# Patient Record
Sex: Male | Born: 1980 | Hispanic: No | Marital: Married | Smoking: Never smoker
Health system: Southern US, Community
[De-identification: ages and names within clinical notes are randomized; demographics above are authoritative.]

---
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
## 2017-10-23 ENCOUNTER — Ambulatory Visit (INDEPENDENT_AMBULATORY_CARE_PROVIDER_SITE_OTHER): Payer: Self-pay | Admitting: Physician Assistant

## 2017-10-23 ENCOUNTER — Other Ambulatory Visit: Payer: Self-pay

## 2017-10-23 ENCOUNTER — Encounter (INDEPENDENT_AMBULATORY_CARE_PROVIDER_SITE_OTHER): Payer: Self-pay | Admitting: Physician Assistant

## 2017-10-23 VITALS — BP 126/72 | HR 69 | Temp 98.6°F | Ht 67.5 in | Wt 203.0 lb

## 2017-10-23 DIAGNOSIS — R1013 Epigastric pain: Secondary | ICD-10-CM

## 2017-10-23 DIAGNOSIS — R229 Localized swelling, mass and lump, unspecified: Secondary | ICD-10-CM

## 2017-10-23 DIAGNOSIS — K219 Gastro-esophageal reflux disease without esophagitis: Secondary | ICD-10-CM

## 2017-10-23 MED ORDER — OMEPRAZOLE 40 MG PO CPDR: 40.0000 mg | DELAYED_RELEASE_CAPSULE | Freq: Every day | ORAL | 3 refills | Status: DC

## 2017-10-23 NOTE — Progress Notes (Signed)
   Subjective:  Patient ID: Tommy Steele, male    DOB: 1980-11-13  Age: 37 y.o. MRN: [id number]  CC: masses on arms, legs  HPI Tommy Steele is a 37 y.o. male with no significant medical history presents with masses on arms and legs. Onset since he was a child      Also complains of acid reflux to the throat with subsequent sleep disturbance. Occurs daily with all foods and associated with epigastric pain that radiates to the left lower pectoralis. Some bloating and flatulence. Has taken OTC anti-acids with some relief. Noted one episode of BRBPR that self resolved over two days. Does not endorse melena, nausea, vomiting, fever, chills, diarrhea, constipation, rash, cafe au-lait spots, swelling, SOB, or GU sxs.      ROS Review of Systems  Constitutional: Negative for chills, fever and malaise/fatigue.  Eyes: Negative for blurred vision.  Respiratory: Negative for shortness of breath.   Cardiovascular: Negative for chest pain and palpitations.  Gastrointestinal: Positive for abdominal pain and heartburn. Negative for nausea.  Genitourinary: Negative for dysuria and hematuria.  Musculoskeletal: Negative for joint pain and myalgias.  Skin: Negative for rash.       nodules  Neurological: Negative for tingling and headaches.  Psychiatric/Behavioral: Negative for depression. The patient is not nervous/anxious.     Objective:  BP 126/72 (BP Location: Left Arm, Patient Position: Sitting, Cuff Size: Normal)   Pulse 69   Temp 98.6 F (37 C) (Oral)   Ht 5' 7.5" (1.715 m)   Wt 203 lb (92.1 kg)   SpO2 98%   BMI 31.33 kg/m   BP/Weight 08/25/8248  Systolic BP 037  Diastolic BP 72  Wt. (Lbs) 203  BMI 31.33      Physical Exam  Constitutional: He is oriented to person, place, and time.  Well developed, well nourished, NAD, polite  HENT:  Head: Normocephalic and atraumatic.  Eyes: No scleral icterus.  Neck: Normal range of motion. Neck supple. No thyromegaly present.   Cardiovascular: Normal rate, regular rhythm and normal heart sounds.  Pulmonary/Chest: Effort normal and breath sounds normal.  Abdominal: Soft. Bowel sounds are normal. There is no tenderness.  Musculoskeletal: He exhibits no edema.  Neurological: He is alert and oriented to person, place, and time.  Skin: Skin is warm and dry. No rash noted. No erythema. No pallor.  Few, small, freely movable nodules in the forearm bilaterally and one in the right thigh.  Psychiatric: He has a normal mood and affect. His behavior is normal. Thought content normal.  Vitals reviewed.    Assessment & Plan:   1. Multiple skin nodules` - CBC with Differential - Comprehensive metabolic panel - ANA w/Reflex - Rheumatoid factor  2. Gastroesophageal reflux disease, esophagitis presence not specified - omeprazole (PRILOSEC) 40 MG capsule; Take 1 capsule (40 mg total) by mouth daily.  Dispense: 30 capsule; Refill: 3  3. Abdominal pain, epigastric - H. pylori antibody, IgG   Meds ordered this encounter  Medications  . omeprazole (PRILOSEC) 40 MG capsule    Sig: Take 1 capsule (40 mg total) by mouth daily.    Dispense:  30 capsule    Refill:  3    Order Specific Question:   Supervising Provider    Answer:   Tresa Garter [[id number]]    Follow-up: Return in about 1 month (around 11/20/2017).   Clent Demark PA

## 2017-10-23 NOTE — Patient Instructions (Signed)
Opciones de alimentos para pacientes con reflujo gastroesofágico - Adultos  (Food Choices for Gastroesophageal Reflux Disease, Adult)  Cuando se tiene reflujo gastroesofágico (ERGE), los alimentos que se ingieren y los hábitos de alimentación son muy importantes. Elegir los alimentos adecuados puede ayudar a aliviar las molestias.  ¿QUÉ PAUTAS DEBO SEGUIR?  · Elija las frutas, los vegetales, los cereales integrales y los productos lácteos con bajo contenido de grasa.  · Elija las carnes de vaca, de pescado y de ave con bajo contenido de grasas.  · Limite las grasas, como los aceites, los aderezos para ensalada, la manteca, los frutos secos y el aguacate.  · Lleve un registro de alimentos. Esto ayuda a identificar los alimentos que ocasionan síntomas.  · Evite los alimentos que le ocasionen síntomas. Pueden ser distintos para cada persona.  · Haga comidas pequeñas durante el día en lugar de 3 comidas abundantes.  · Coma lentamente, en un lugar donde esté distendido.  · Limite el consumo de alimentos fritos.  · Cocine los alimentos utilizando métodos que no sean la fritura.  · Evite el consumo alcohol.  · Evite beber grandes cantidades de líquidos con las comidas.  · Evite agacharse o recostarse hasta después de 2 o 3 horas de haber comido.    ¿QUÉ ALIMENTOS NO SE RECOMIENDAN?  Estos son algunos alimentos y bebidas que pueden empeorar los síntomas:  Vegetales  Tomates. Jugo de tomate. Salsa de tomate y espagueti. Ajíes. Cebolla y ajo. Rábano picante.  Frutas  Naranjas, pomelos y limón (fruta y jugo).  Carnes  Carnes de vaca, de pescado y de ave con gran contenido de grasas. Esto incluye los perros calientes, las costillas, el jamón, la salchicha, el salame y el tocino.  Lácteos  Leche entera y leche chocolatada. Crema ácida. Crema. Mantequilla. Helados. Queso crema.  Bebidas  Té o café. Bebidas gaseosas o bebidas energizantes.  Condimentos  Salsa picante. Salsa barbacoa.  Dulces/postres   Chocolate y cacao. Rosquillas. Menta y mentol.  Grasas y aceites  Alimentos muy grasos. Esto incluye las papas fritas.  Otros  Vinagre. Especias picantes. Esto incluye la pimienta negra, la pimienta blanca, la pimienta roja, la pimienta de cayena, el curry en polvo, los clavos de olor, el jengibre y el chile en polvo.  Esta no es una lista completa de los alimentos y las bebidas que se deben evitar. Comuníquese con el nutricionista para recibir más información.  Esta información no tiene como fin reemplazar el consejo del médico. Asegúrese de hacerle al médico cualquier pregunta que tenga.  Document Released: 12/18/2011 Document Revised: 07/09/2014 Document Reviewed: 04/22/2013  Elsevier Interactive Patient Education © 2017 Elsevier Inc.

## 2017-10-24 ENCOUNTER — Telehealth (INDEPENDENT_AMBULATORY_CARE_PROVIDER_SITE_OTHER): Payer: Self-pay

## 2017-10-24 LAB — COMPREHENSIVE METABOLIC PANEL
ALK PHOS: 104 IU/L (ref 39–117)
ALT: 37 IU/L (ref 0–44)
AST: 23 IU/L (ref 0–40)
Albumin/Globulin Ratio: 1.8 (ref 1.2–2.2)
Albumin: 4.7 g/dL (ref 3.5–5.5)
BUN/Creatinine Ratio: 18 (ref 9–20)
BUN: 13 mg/dL (ref 6–20)
Bilirubin Total: 0.6 mg/dL (ref 0.0–1.2)
CALCIUM: 10 mg/dL (ref 8.7–10.2)
CO2: 22 mmol/L (ref 20–29)
CREATININE: 0.74 mg/dL — AB (ref 0.76–1.27)
Chloride: 106 mmol/L (ref 96–106)
GFR calc Af Amer: 137 mL/min/{1.73_m2} (ref >59)
GFR, EST NON AFRICAN AMERICAN: 119 mL/min/{1.73_m2} (ref >59)
GLOBULIN, TOTAL: 2.6 g/dL (ref 1.5–4.5)
GLUCOSE: 94 mg/dL (ref 65–99)
Potassium: 4 mmol/L (ref 3.5–5.2)
SODIUM: 140 mmol/L (ref 134–144)
Total Protein: 7.3 g/dL (ref 6.0–8.5)

## 2017-10-24 LAB — CBC WITH DIFFERENTIAL/PLATELET
BASOS ABS: 0 10*3/uL (ref 0.0–0.2)
Basos: 0 %
EOS (ABSOLUTE): 0.2 10*3/uL (ref 0.0–0.4)
EOS: 2 %
HEMATOCRIT: 43.3 % (ref 37.5–51.0)
HEMOGLOBIN: 14.5 g/dL (ref 13.0–17.7)
IMMATURE GRANULOCYTES: 0 %
Immature Grans (Abs): 0 10*3/uL (ref 0.0–0.1)
Lymphocytes Absolute: 2.4 10*3/uL (ref 0.7–3.1)
Lymphs: 36 %
MCH: 29.6 pg (ref 26.6–33.0)
MCHC: 33.5 g/dL (ref 31.5–35.7)
MCV: 88 fL (ref 79–97)
MONOCYTES: 5 %
Monocytes Absolute: 0.3 10*3/uL (ref 0.1–0.9)
NEUTROS PCT: 57 %
Neutrophils Absolute: 3.8 10*3/uL (ref 1.4–7.0)
Platelets: 225 10*3/uL (ref 150–379)
RBC: 4.9 x10E6/uL (ref 4.14–5.80)
RDW: 14 % (ref 12.3–15.4)
WBC: 6.8 10*3/uL (ref 3.4–10.8)

## 2017-10-24 LAB — ANA W/REFLEX: Anti Nuclear Antibody(ANA): NEGATIVE

## 2017-10-24 LAB — H. PYLORI ANTIBODY, IGG

## 2017-10-24 LAB — RHEUMATOID FACTOR

## 2017-10-24 NOTE — Telephone Encounter (Signed)
-----   Message from Clent Demark, PA-C sent at 10/24/2017  1:49 PM EDT ----- All labs are normal.

## 2017-10-24 NOTE — Telephone Encounter (Signed)
Patient is aware of normal labs. Tommy Steele, CMA

## 2017-12-04 ENCOUNTER — Ambulatory Visit (INDEPENDENT_AMBULATORY_CARE_PROVIDER_SITE_OTHER): Payer: Self-pay | Admitting: Physician Assistant

## 2017-12-04 ENCOUNTER — Other Ambulatory Visit: Payer: Self-pay

## 2017-12-04 ENCOUNTER — Encounter (INDEPENDENT_AMBULATORY_CARE_PROVIDER_SITE_OTHER): Payer: Self-pay | Admitting: Physician Assistant

## 2017-12-04 VITALS — BP 100/55 | HR 67 | Temp 98.2°F | Ht 67.5 in | Wt 201.6 lb

## 2017-12-04 DIAGNOSIS — R229 Localized swelling, mass and lump, unspecified: Secondary | ICD-10-CM

## 2017-12-04 DIAGNOSIS — L255 Unspecified contact dermatitis due to plants, except food: Secondary | ICD-10-CM

## 2017-12-04 DIAGNOSIS — K219 Gastro-esophageal reflux disease without esophagitis: Secondary | ICD-10-CM

## 2017-12-04 MED ORDER — PREDNISONE 10 MG PO TABS: ORAL_TABLET | ORAL | 0 refills | Status: DC

## 2017-12-04 MED ORDER — OMEPRAZOLE 40 MG PO CPDR: 40.0000 mg | DELAYED_RELEASE_CAPSULE | Freq: Every day | ORAL | 11 refills | Status: DC

## 2017-12-04 MED ORDER — PREDNISONE 10 MG PO TABS: 10.0000 mg | ORAL_TABLET | Freq: Every day | ORAL | 0 refills | Status: DC

## 2017-12-04 NOTE — Progress Notes (Signed)
Subjective:  Patient ID: Tommy Steele, male    DOB: [birth year]  Age: 37 y.o. MRN: [id number]  CC: f/u GERD  HPI Tommy Steele is a 37 y.o. male with a medical history of GERD and skin nodules presents to f/u on GERD and skin nodules.  States GERD is much better since taking omeprazole. No longer feels discomfort at night. Avoiding tomato sauce, spicy foods, alcohol and chocolate. H pylori negative.     Multiple skin nodules are persistent but unchanged. ANA negative, Rheumatoid factor negative, CBC/CMP normal. Nodules since childhood. Not painful or bothersome but worrisome to patient. Located on arms, legs, and back.     Also has redness, warmth, and pruritus on the calf bilaterally since yesterday. Attributed to [place] bushes [place] his property. Has not taken anything for relief. Applied ice with relief. No f/c/n/v, SOB, edema, or abdominal pain.      Outpatient Medications Prior to Visit  Medication Sig Dispense Refill  . omeprazole (PRILOSEC) 40 MG capsule Take 1 capsule (40 mg total) by mouth daily. 30 capsule 3   No facility-administered medications prior to visit.      ROS Review of Systems  Constitutional: Negative for chills, fever and malaise/fatigue.  Eyes: Negative for blurred vision.  Respiratory: Negative for shortness of breath.   Cardiovascular: Negative for chest pain and palpitations.  Gastrointestinal: Negative for abdominal pain and nausea.  Genitourinary: Negative for dysuria and hematuria.  Musculoskeletal: Negative for joint pain and myalgias.  Skin: Positive for itching and rash.       Skin nodules  Neurological: Negative for tingling and headaches.  Psychiatric/Behavioral: Negative for depression. The patient is not nervous/anxious.     Objective:  Ht 5' 7.5" (1.715 m)   Wt 201 lb 9.6 oz (91.4 kg)   BMI 31.11 kg/m   BP/Weight 12/04/2017 0/03/3817  Systolic BP - 299  Diastolic BP - 72  Wt. (Lbs) 201.6 203  BMI 31.11 31.33       Physical Exam  Constitutional: He is oriented to person, place, and time.  Well developed, well nourished, NAD, polite  HENT:  Head: Normocephalic and atraumatic.  Eyes: No scleral icterus.  Neck: Normal range of motion. Neck supple. No thyromegaly present.  Cardiovascular: Normal rate, regular rhythm and normal heart sounds.  Pulmonary/Chest: Effort normal and breath sounds normal.  Abdominal: Soft. Bowel sounds are normal. There is no tenderness.  Musculoskeletal: He exhibits no edema.  Neurological: He is alert and oriented to person, place, and time.  Skin:  Generalized erythema of the calf bilaterally with mildly increased warmth and few excoriations; no lesions, crusting, scaling, suppuration, or bleeding.  Psychiatric: He has a normal mood and affect. His behavior is normal. Thought content normal.  Vitals reviewed.    Assessment & Plan:   1. Gastroesophageal reflux disease, esophagitis presence not specified - omeprazole (PRILOSEC) 40 MG capsule; Take 1 capsule (40 mg total) by mouth daily.  Dispense: 30 capsule; Refill: 11  2. Multiple skin nodules - Ambulatory referral to Dermatology - ANA negative, Rheumatoid factor negative, CBC/CMP normal.  3. Plant dermatitis - Begin predniSONE (DELTASONE) 10 MG tablet; Day 1 take 6 tablets  Day 2 take 6 tablets Day 3 take 5 tablets  Day 4 take 5 tablets   Day 5 take 4 tablets  Day 6 take 4 tablets  Day 7 take 3 tablets   Day 8 take 3 tablets  Day 9 take 2 tablets Day 10 take 2 tablets  Day 11 take 1 tablet   Day 12 take 1 tablet  Dispense: 42 tablet; Refill: 0   Meds ordered this encounter  Medications  . DISCONTD: omeprazole (PRILOSEC) 40 MG capsule    Sig: Take 1 capsule (40 mg total) by mouth daily.    Dispense:  30 capsule    Refill:  11    Order Specific Question:   Supervising Provider    Answer:   Charlott Rakes [[id number]]  . DISCONTD: predniSONE (DELTASONE) 10 MG tablet    Sig: Take 1 tablet (10 mg total) by  mouth daily with breakfast for 12 days. Day 1 take 6 tablets  Day 2 take 6 tablets Day 3 take 5 tablets  Day 4 take 5 tablets   Day 5 take 4 tablets  Day 6 take 4 tablets  Day 7 take 3 tablets   Day 8 take 3 tablets  Day 9 take 2 tablets Day 10 take 2 tablets  Day 11 take 1 tablet   Day 12 take 1 tablet    Dispense:  42 tablet    Refill:  0    Order Specific Question:   Supervising Provider    Answer:   Charlott Rakes [[id number]]  . predniSONE (DELTASONE) 10 MG tablet    Sig: Day 1 take 6 tablets  Day 2 take 6 tablets Day 3 take 5 tablets  Day 4 take 5 tablets   Day 5 take 4 tablets  Day 6 take 4 tablets  Day 7 take 3 tablets   Day 8 take 3 tablets  Day 9 take 2 tablets Day 10 take 2 tablets  Day 11 take 1 tablet   Day 12 take 1 tablet    Dispense:  42 tablet    Refill:  0    Order Specific Question:   Supervising Provider    Answer:   Charlott Rakes [[id number]]  . omeprazole (PRILOSEC) 40 MG capsule    Sig: Take 1 capsule (40 mg total) by mouth daily.    Dispense:  30 capsule    Refill:  11    Order Specific Question:   Supervising Provider    Answer:   Charlott Rakes [[id number]]    Follow-up: Return if symptoms worsen or fail to improve.   Clent Demark PA

## 2017-12-04 NOTE — Patient Instructions (Signed)
Dermatitis por hiedra venenosa (Poison Ivy Dermatitis) La dermatitis por hiedra venenosa se caracteriza por el enrojecimiento y Conservation officer, historic buildings (inflamacin) en la piel. Se produce por una sustancia qumica que se encuentra en las hojas de la hiedra venenosa. Tambin puede causar picazn, erupcin cutnea y ampollas. Los sntomas suelen desaparecer en 1 a 2semanas. Puede contraer esta afeccin si toca una planta de hiedra venenosa. Tambin puede contraerla si toca algo que contenga la sustancia qumica. Esto incluye tocar animales u objetos que hayan estado en contacto con la planta. CUIDADOS EN EL HOGAR Instrucciones generales  Tome o aplique los medicamentos de venta libre y los recetados solamente como se lo haya indicado el mdico.  Si toca una hiedra venenosa, lvese la piel con agua fra y jabn de inmediato.  Utilice cremas con hidrocortisona o una locin con calamina para aliviar la picazn, en caso de que sea necesario.  Tome baos de avena segn sea necesario. Use avena coloidal. Puede conseguirla en la tienda de comestibles o en la farmacia local. [place] instrucciones del envase.  No se rasque ni se refriegue la piel.  Mientras tenga erupcin cutnea, lave las prendas que use inmediatamente despus de sacrselas. Prevencin  Aprenda a reconocer la hiedra venenosa, para poder evitarla. Esta planta tiene tres hojas, con ramas que florecen de un solo tallo. Las hojas son brillantes. Tienen bordes irregulares que terminan en una punta en el frente.  Si ha tocado una hiedra venenosa, lvese con agua y [place] de inmediato. Asegrese de lavarse debajo de las uas de las manos.  Cuando haga excursiones o vaya de campamento, use pantalones largos, camisa de [employer], medias altas y botas para caminar. Tambin puede colocarse una locin en la piel que ayuda a evitar el contacto con la sustancia qumica de la planta.  Si cree que su ropa o el equipo especfico para el aire libre entraron en  contacto con una hiedra venenosa, enjuguelos con Vickie Epley de jardn antes de llevarlos a su casa. SOLICITE AYUDA SI:  Tiene lceras abiertas en la zona de la erupcin cutnea.  Tiene ms enrojecimiento, hinchazn o dolor en la zona afectada.  Tiene enrojecimiento que se extiende ms all de la zona de la erupcin cutnea.  Emana lquido, sangre o pus de la zona afectada.  Tiene fiebre.  Tiene una erupcin cutnea en una zona extensa del cuerpo.  Tiene una erupcin cutnea en los ojos, la boca o los genitales.  La erupcin cutnea no mejora despus de [employer]. SOLICITE AYUDA DE INMEDIATO SI:  Se le hincha la cara o se le hinchan los prpados al punto de cerrarse.  Tiene dificultad para respirar.  Presenta dificultad para tragar. Esta informacin no tiene Marine scientist el consejo del mdico. Asegrese de hacerle al mdico cualquier pregunta que tenga. Document Released: 10/03/2010 Document Revised: 10/10/2015 Document Reviewed: 11/24/2014 Elsevier Interactive Patient Education  [employer].

## 2018-05-23 ENCOUNTER — Encounter (INDEPENDENT_AMBULATORY_CARE_PROVIDER_SITE_OTHER): Payer: Self-pay | Admitting: Physician Assistant

## 2018-05-23 ENCOUNTER — Ambulatory Visit (INDEPENDENT_AMBULATORY_CARE_PROVIDER_SITE_OTHER): Payer: Self-pay | Admitting: Physician Assistant

## 2018-05-23 VITALS — BP 109/63 | HR 75 | Temp 98.4°F | Resp 18 | Ht 67.0 in | Wt 208.0 lb

## 2018-05-23 DIAGNOSIS — K219 Gastro-esophageal reflux disease without esophagitis: Secondary | ICD-10-CM

## 2018-05-23 DIAGNOSIS — R229 Localized swelling, mass and lump, unspecified: Secondary | ICD-10-CM

## 2018-05-23 NOTE — Patient Instructions (Addendum)
Lipoma (Lipoma) Un lipoma es un tumor no canceroso (benigno) formado por clulas de grasa. Es un tipo muy frecuente de [employer] tejidos blandos. Por lo general, los lipomas se encuentran debajo de la piel (subcutneos). Pueden aparecer en cualquier tejido del cuerpo que contenga grasa. Las [employer] los lipomas aparecen con mayor frecuencia incluyen la espalda, los hombros, las nalgas y los muslos. Los lipomas crecen lentamente y, en general, son indoloros. La mayora de los lipomas no causan problemas y no requieren Clinical research associate. CAUSAS Se desconoce la causa de esta afeccin. FACTORES DE RIESGO Es ms probable que esta afeccin se manifieste en:  Personas de 40a 60aos.  Personas con antecedentes familiares de lipomas. SNTOMAS Por lo general, el lipoma aparece como un pequeo bulto redondo debajo de la piel. Puede ser Bea Laura o gomoso al tacto, y la firmeza puede variar. La mayora de los lipomas no son dolorosos. Sin embargo, el lipoma puede doler si se encuentra en un rea en la que hace presin [employer] nervios. DIAGNSTICO Por lo general, el lipoma puede diagnosticarse con un examen fsico. Tambin pueden hacerle estudios para confirmar el diagnstico y Paramedic otras enfermedades. Entre ellos:  Pruebas de diagnstico por imgenes, como resonancia magntica o tomografa computada.  Extraccin de [place] de tejido para analizar con un microscopio (biopsia). TRATAMIENTO Los lipomas pequeos que no causan problemas no requieren tratamiento. Si el lipoma contina creciendo o causa problemas, la extirpacin suele ser la mejor opcin. Los lipomas tambin pueden extirparse para mejorar el aspecto. Por lo general, esto consiste en una ciruga en la que se extirpan las clulas de grasa y la cpsula que las rodea. Para este procedimiento, se utiliza con frecuencia un medicamento que adormece el rea (anestesia local). INSTRUCCIONES PARA EL CUIDADO EN EL HOGAR  Concurra a  todas las visitas de control como se lo haya indicado el mdico. Esto es importante. SOLICITE ATENCIN MDICA SI:  El lipoma se agranda o se endurece.  El lipoma comienza a causarle dolor, se enrojece o se hincha cada vez ms. Estos podran ser signos de infeccin o de una afeccin ms grave. Esta informacin no tiene Marine scientist el consejo del mdico. Asegrese de hacerle al mdico cualquier pregunta que tenga. Document Released: 03/28/2005 Document Revised: 11/02/2014 Document Reviewed: 06/14/2014 Elsevier Interactive Patient Education  [employer].

## 2018-05-23 NOTE — Progress Notes (Signed)
Subjective:  Patient ID: Tommy Steele, male    DOB: [birth year]  Age: 37 y.o. MRN: [id number]  CC: stomach pain   HPI Tommy Steele is a 37 y.o. male with a medical history of GERD and ?skin nodules presents to f/u on GERD. States he has become aware of which foods to avoid and has greatly reduced his gastritis. Finished taking his omeprazole and feels he does not need a refill. Does not endorse abdominal pain, abdominal bloating, BRBPR, melena, nausea, vomiting, fever, chills, LE edema, CP, or SOB.     Pt reports another skin nodule that has developed in his right flank. Nodule is nontender. Has nodules in his arms and right leg. Was referred to dermatology but was denied because patient has not finished his CAFA or obtained orange card.         Outpatient Medications Prior to Visit  Medication Sig Dispense Refill  . omeprazole (PRILOSEC) 40 MG capsule Take 1 capsule (40 mg total) by mouth daily. 30 capsule 11  . predniSONE (DELTASONE) 10 MG tablet Day 1 take 6 tablets  Day 2 take 6 tablets Day 3 take 5 tablets  Day 4 take 5 tablets   Day 5 take 4 tablets  Day 6 take 4 tablets  Day 7 take 3 tablets   Day 8 take 3 tablets  Day 9 take 2 tablets Day 10 take 2 tablets  Day 11 take 1 tablet   Day 12 take 1 tablet 42 tablet 0   No facility-administered medications prior to visit.      ROS Review of Systems  Constitutional: Negative for chills, fever and malaise/fatigue.  Eyes: Negative for blurred vision.  Respiratory: Negative for shortness of breath.   Cardiovascular: Negative for chest pain and palpitations.  Gastrointestinal: Negative for abdominal pain and nausea.  Genitourinary: Negative for dysuria and hematuria.  Musculoskeletal: Negative for joint pain and myalgias.  Skin: Negative for rash.       nodules  Neurological: Negative for tingling and headaches.  Psychiatric/Behavioral: Negative for depression. The patient is not nervous/anxious.     Objective:   There were no vitals taken for this visit.  BP/Weight 12/04/2017 11/27/4130  Systolic BP 440 102  Diastolic BP 55 72  Wt. (Lbs) 201.6 203  BMI 31.11 31.33      Physical Exam  Constitutional: He is oriented to person, place, and time.  Well developed, overweight, NAD, polite  HENT:  Head: Normocephalic and atraumatic.  Eyes: No scleral icterus.  Neck: Normal range of motion. Neck supple. No thyromegaly present.  Pulmonary/Chest: Effort normal.  Musculoskeletal: He exhibits no edema.  Neurological: He is alert and oriented to person, place, and time.  Skin: Skin is warm and dry. No rash noted. No erythema. No pallor.  Freely movable, somewhat firm nodules located on arms, right leg, and right abdominal flank.  Psychiatric: He has a normal mood and affect. His behavior is normal. Thought content normal.  Vitals reviewed.    Assessment & Plan:   1. Multiple skin nodules - Will need biopsy and pathological review. Initially suspected lipoma but nodules feel too firm. Referred to dermatology but was denied due to lack of insurance/CAFA/orange card.   2. Gastroesophageal reflux disease, esophagitis presence not specified - Pt may call to ask for refills of omeprazole if needed. For now, pt wants to regulate diet.     Follow-up: Return if symptoms worsen or fail to improve.   Clent Demark PA

## 2018-08-29 ENCOUNTER — Ambulatory Visit: Payer: Self-pay | Admitting: Family Medicine

## 2018-09-01 ENCOUNTER — Ambulatory Visit (INDEPENDENT_AMBULATORY_CARE_PROVIDER_SITE_OTHER): Payer: Self-pay | Admitting: Family Medicine

## 2018-09-01 VITALS — BP 110/80 | HR 63 | Temp 98.4°F | Wt 213.0 lb

## 2018-09-01 DIAGNOSIS — Z7689 Persons encountering health services in other specified circumstances: Secondary | ICD-10-CM

## 2018-09-01 DIAGNOSIS — R141 Gas pain: Secondary | ICD-10-CM

## 2018-09-01 DIAGNOSIS — D179 Benign lipomatous neoplasm, unspecified: Secondary | ICD-10-CM

## 2018-09-01 NOTE — Patient Instructions (Signed)
It was great meeting you today!  Your bumps are likely things called lipomas.  I recommend against getting these removed, as that process likely will do more harm than good.  There is also high likelihood they will come back.  Losing some weight will likely help reduce them.  Lipomas are benign with no risk of becoming anything more concerning.  They are purely cosmetic.  No need for any lab work today.  Your abdominal pain sounds most consistent with gaseous pain.  If you get it and does not go away please come back and see me.  Your testicle pain is slightly concerning, but given that it occurs so rarely and does go away on its own I am not sure there is any work-up that needs to be done today.  Please let me know if this comes back, and we can investigate a little further.

## 2018-09-04 NOTE — Progress Notes (Signed)
  HPI:  Patient presents today for a new patient appointment to establish general primary care, also to discuss bumps in arms, abdomen.  Patient has multiple subcutaneous nodules in both arms, abdomen.  States that some have been there for a very long time and have possibly or slowly increase in size.  Some are fairly new. He states that he was initially referred to dermatology by his old PCP but there was some concern over whether insurance would cover the visit.  Patient states that his gerd has been well controlled with his ppi. He does say that he occasionally have some left lower quadrant bloating. When he walks around and passes flatus it goes away.  ROS: See HPI  Past Medical Hx:  -GERD -Lipomas  Past Surgical Hx:  -None  Family Hx: updated in Epic  Social Hx: Lives in [place] with wife.  Does not drink alcohol, use tobacco, use illicit substances.  Health Maintenance:  -Up-to-date on health maintenance items  PHYSICAL EXAM: BP 110/80   Pulse 63   Temp 98.4 F (36.9 C) (Oral)   Wt 213 lb (96.6 kg)   SpO2 98%   BMI 33.36 kg/m  Gen: well appearing 38 year old latino male, no acute distress, resting comfortably HEENT: mmm, bilateral ear canals without erythema, no cervical lymphadenopathy Heart: rrr, no m/r/g Lungs: lungs CTAB, no accessory muscle use Abdomen: soft, non-tender, non-distended. Multiple nodular, soft structures noted in bilateral abdomen. Non painful Neuro: cn 2-12 intact, no focal deficit Bilateral arms: On inspection multiple soft, nodular, freely mobile structures. No painful. Otherwise strength, ROM, and neurovascular all normal.  ASSESSMENT/PLAN:  # Health maintenance:  -up to date  Establishing care with new doctor, encounter for Up to date on health maintenance items  Abdominal gas pain Abdominal pains likely 2/2 gaseous bloating. Can take gas-ex if needed to resole pain.  Lipoma Patient with multiple skin nodules consistent with  lipomas. Explained that these are benign and the only indication for removal is purely cosmetic unless they are in an area that is causing significant pain. With this information patient is no longer interested in dermatology referral.     FOLLOW UP: Follow up in 1 year for annual wellness  Guadalupe Dawn MD PGY-2 Family Medicine Resident

## 2018-09-05 ENCOUNTER — Encounter: Payer: Self-pay | Admitting: Family Medicine

## 2018-09-05 DIAGNOSIS — R141 Gas pain: Secondary | ICD-10-CM

## 2018-09-05 DIAGNOSIS — D179 Benign lipomatous neoplasm, unspecified: Secondary | ICD-10-CM | POA: Insufficient documentation

## 2018-09-05 DIAGNOSIS — Z7689 Persons encountering health services in other specified circumstances: Secondary | ICD-10-CM | POA: Insufficient documentation

## 2018-09-05 DIAGNOSIS — R109 Unspecified abdominal pain: Secondary | ICD-10-CM | POA: Insufficient documentation

## 2018-09-05 NOTE — Assessment & Plan Note (Signed)
Up to date on health maintenance items

## 2018-09-05 NOTE — Assessment & Plan Note (Signed)
Patient with multiple skin nodules consistent with lipomas. Explained that these are benign and the only indication for removal is purely cosmetic unless they are in an area that is causing significant pain. With this information patient is no longer interested in dermatology referral.

## 2018-09-05 NOTE — Assessment & Plan Note (Signed)
Abdominal pains likely 2/2 gaseous bloating. Can take gas-ex if needed to resole pain.

## 2018-12-16 ENCOUNTER — Ambulatory Visit (INDEPENDENT_AMBULATORY_CARE_PROVIDER_SITE_OTHER): Payer: Self-pay | Admitting: Family Medicine

## 2018-12-16 ENCOUNTER — Other Ambulatory Visit: Payer: Self-pay

## 2018-12-16 VITALS — BP 126/83 | HR 74

## 2018-12-16 DIAGNOSIS — K219 Gastro-esophageal reflux disease without esophagitis: Secondary | ICD-10-CM

## 2018-12-16 DIAGNOSIS — D179 Benign lipomatous neoplasm, unspecified: Secondary | ICD-10-CM

## 2018-12-16 DIAGNOSIS — R141 Gas pain: Secondary | ICD-10-CM

## 2018-12-16 MED ORDER — OMEPRAZOLE 40 MG PO CPDR: 40.0000 mg | DELAYED_RELEASE_CAPSULE | Freq: Every day | ORAL | 0 refills | Status: DC

## 2018-12-16 NOTE — Patient Instructions (Addendum)
It was great seeing you again today!  I think your abdominal pains are due to acid overproduction which is causing increased bloating due to gas.  I think taking a daily acid suppressing medication called omeprazole is a good idea.  I sent a 30-day supply to your pharmacy.  If you find you are having bad gas pains he can take Gas-X as needed.  You can find this at the pharmacy as well.  I gave you a handout on this.  In regards to your sleep I think improving your abdominal pain will be helpful for this.  We also discussed different techniques for improving her sleep hygiene.  Most notably not being on your phone or watching television an hour before sleep.  During this time you can do what ever helps you relax is not as activating as these activities.

## 2018-12-18 ENCOUNTER — Encounter: Payer: Self-pay | Admitting: Family Medicine

## 2018-12-18 NOTE — Assessment & Plan Note (Signed)
Again explained that patient's abdominal pain is likely secondary to gaseous bloating.  Again recommended taking Gas-X to help with this.  We will trial patient on omeprazole as this is helped him in the past.  Is possible that intractable GERD is causing some these issues.

## 2018-12-18 NOTE — Assessment & Plan Note (Signed)
Patient has thought about it and would like to have his lipomas removed.  Placed dermatology referral at this visit.Marland Kitchen

## 2018-12-18 NOTE — Progress Notes (Signed)
   HPI 38 year old male presents for abdominal pain.  He states that he always gets the pain after eating.  Is a dull ache that felt like bloating.  He has not been taking any medications for this.  Had previously seen patient for this issue and recommended Gas-X for his bloating issues are due to gaseous abdominal pain.  Unfortunately has not been taking Gas-X at all.  Patient does not feel like he is having any issues with reflux at this time.  States that he is not having the burning back of his throat.  He has not been taking omeprazole.  Patient is also interested in having his lipomas removed.  States that he would "just like a piece of mind of having removed".  Explained that the reason for doing this is purely cosmetic.  CC: abdominal pain   ROS:   Review of Systems See HPI for ROS.   CC, SH/smoking status, and VS noted  Objective: There were no vitals taken for this visit. Gen: 38 year old [place] male, no acute stress, resting comfortably HEENT: NCAT, EOMI, PERRL CV: RRR, no murmur Resp: CTAB, no wheezes, non-labored Abd: Soft, nontender nondistended.  Bowel sounds present.  Some hyperresonance noted on percussion consistent with bowel gas. Neuro: Alert and oriented, Speech clear, No gross deficits Skin: Medium sized, nodular, very soft structures noted in the chest and right rib area.  Consistent with lipoma   Assessment and plan:  Lipoma Patient has thought about it and would like to have his lipomas removed.  Placed dermatology referral at this visit..  Abdominal gas pain Again explained that patient's abdominal pain is likely secondary to gaseous bloating.  Again recommended taking Gas-X to help with this.  We will trial patient on omeprazole as this is helped him in the past.  Is possible that intractable GERD is causing some these issues.   Orders Placed This Encounter  Procedures  . Ambulatory referral to Dermatology    Referral Priority:   Routine    Referral  Type:   Consultation    Referral Reason:   Specialty Services Required    Requested Specialty:   Dermatology    Number of Visits Requested:   1    Meds ordered this encounter  Medications  . omeprazole (PRILOSEC) 40 MG capsule    Sig: Take 1 capsule (40 mg total) by mouth daily.    Dispense:  30 capsule    Refill:  0     Guadalupe Dawn MD PGY-2 Family Medicine Resident  12/18/2018 10:01 AM

## 2019-01-05 ENCOUNTER — Encounter: Payer: Self-pay | Admitting: Family Medicine

## 2019-01-05 ENCOUNTER — Ambulatory Visit (INDEPENDENT_AMBULATORY_CARE_PROVIDER_SITE_OTHER): Payer: Self-pay | Admitting: Family Medicine

## 2019-01-05 ENCOUNTER — Other Ambulatory Visit: Payer: Self-pay

## 2019-01-05 VITALS — BP 110/72 | HR 61 | Wt 189.0 lb

## 2019-01-05 DIAGNOSIS — K921 Melena: Secondary | ICD-10-CM

## 2019-01-05 DIAGNOSIS — D179 Benign lipomatous neoplasm, unspecified: Secondary | ICD-10-CM

## 2019-01-05 LAB — HEMOCCULT GUIAC POC 1CARD (OFFICE): Fecal Occult Blood, POC: NEGATIVE

## 2019-01-05 NOTE — Assessment & Plan Note (Signed)
Patient has not heard from dermatology referral yet.  Upon review it appears due to his orange card status referral was not made until July.  Advised patient that it can take several months for dermatology to have an availability.

## 2019-01-05 NOTE — Patient Instructions (Signed)
It was nice to meet you today,  I want to send you to a referral to the gastroenterologist because of your complaints of bloody stools along with weight loss and upper GI symptoms such as abdominal pain.  I just want to rule out more serious causes, although I think right now you likely have combination of constipation and hemorrhoids that is causing your bloody stools.  You can take MiraLAX twice a day to help with constipation.  Start by taking 1 capful of MiraLAX once a day, and if you still having improvement in the quality of your stool, then start to twice a day.  This should help with the constipation and hemorrhoids.  I do not see any external hemorrhoid today but that does not mean you don't have internal hemorrhoids.    I will call you with the results of your lab work when I receive it.    Have a great day,   Clemetine Marker MD

## 2019-01-05 NOTE — Assessment & Plan Note (Signed)
Patient with multiple episodes of bright red blood per rectum and tenderness on rectal exam.  Patient has history of dyspepsia controlled with Prilosec.  No nausea or vomiting.  Hemoccult was negative.  Given the patient's bloody stools and recent 20 pound weight loss decided patient would benefit from GI referral with colonoscopy/EGD to rule out more serious cause such as inflammatory bowel disease or colorectal cancer. -Referral to GI - CBC, CMP, CRP, ESR.

## 2019-01-05 NOTE — Progress Notes (Signed)
   [facility] Phone: [phone]   cc: Bloody stools  Subjective:  Blood: Patient complains of bright red blood in his stool.  Most recent one was 4 days ago.  He has not had any black stools.  Patient states that he starts having blood in his stool if he eats "hard foods".  He is currently on a soft food diet which includes fruit, boiled vegetables, soup, grilled chicken.  Prior to most recent episode he had had a cookout that included tortillas.  approximately 1 month ago the patient was seen in the clinic for abdominal pain and bloating.  Patient was given Prilosec and Gas-X.  States that Gas-X does not help, but that the Prilosec has reduced his abdominal pain.  Patient has had issues with dyspepsia, acid reflux since high school.  He currently has no nausea or vomiting but does have some regurgitation symptoms.  Patient has had weight loss due to restricted diet.  He is not trying to lose weight.  Lipomas: Patient has several lipomas on his arms and chest for which he was referred to dermatology.  He has not heard back from them yet, and like to know if I have any updates for him about his referral.  The lipomas do not hurt.  He has had them for several years.  .     ROS: See HPI for pertinent positives and negatives  Past Medical History  Family history reviewed for today's visit. No changes.   Objective: BP 110/72   Pulse 61   Wt 189 lb (85.7 kg)   SpO2 98%   BMI 29.60 kg/m  Gen: NAD, alert and oriented, cooperative with exam CV: normal rate, regular rhythm. No murmurs, no rubs.  Resp: LCTAB, no wheezes, crackles. normal work of breathing GI: nontender to palpation, BS present, no guarding or organomegaly.  Appears to have some area of firmness on the lower left quadrant relative to other areas.  Patient with rectal tenderness on digital exam.  No masses appreciated.  No hemorrhoids visualized Skin: Several soft lumps on the forearms bilaterally. Psych:  Appropriate behavior  Assessment/Plan: Bloody stool Patient with multiple episodes of bright red blood per rectum and tenderness on rectal exam.  Patient has history of dyspepsia controlled with Prilosec.  No nausea or vomiting.  Hemoccult was negative.  Given the patient's bloody stools and recent 20 pound weight loss decided patient would benefit from GI referral with colonoscopy/EGD to rule out more serious cause such as inflammatory bowel disease or colorectal cancer. -Referral to GI - CBC, CMP, CRP, ESR.  Lipoma Patient has not heard from dermatology referral yet.  Upon review it appears due to his orange card status referral was not made until July.  Advised patient that it can take several months for dermatology to have an availability.    Clemetine Marker, MD PGY-1

## 2019-01-06 LAB — CBC
Hematocrit: 44.8 % (ref 37.5–51.0)
Hemoglobin: 15 g/dL (ref 13.0–17.7)
MCH: 29.5 pg (ref 26.6–33.0)
MCHC: 33.5 g/dL (ref 31.5–35.7)
MCV: 88 fL (ref 79–97)
Platelets: 210 10*3/uL (ref 150–450)
RBC: 5.08 x10E6/uL (ref 4.14–5.80)
RDW: 12.3 % (ref 11.6–15.4)
WBC: 6.4 10*3/uL (ref 3.4–10.8)

## 2019-01-06 LAB — COMPREHENSIVE METABOLIC PANEL
ALT: 18 IU/L (ref 0–44)
AST: 13 IU/L (ref 0–40)
Albumin/Globulin Ratio: 1.8 (ref 1.2–2.2)
Albumin: 4.8 g/dL (ref 4.0–5.0)
Alkaline Phosphatase: 111 IU/L (ref 39–117)
BUN/Creatinine Ratio: 11 (ref 9–20)
BUN: 10 mg/dL (ref 6–20)
Bilirubin Total: 0.9 mg/dL (ref 0.0–1.2)
CO2: 23 mmol/L (ref 20–29)
Calcium: 10 mg/dL (ref 8.7–10.2)
Chloride: 102 mmol/L (ref 96–106)
Creatinine, Ser: 0.95 mg/dL (ref 0.76–1.27)
GFR calc Af Amer: 118 mL/min/{1.73_m2} (ref >59)
GFR calc non Af Amer: 102 mL/min/{1.73_m2} (ref >59)
Globulin, Total: 2.6 g/dL (ref 1.5–4.5)
Glucose: 83 mg/dL (ref 65–99)
Potassium: 4.3 mmol/L (ref 3.5–5.2)
Sodium: 138 mmol/L (ref 134–144)
Total Protein: 7.4 g/dL (ref 6.0–8.5)

## 2019-01-06 LAB — C-REACTIVE PROTEIN: CRP: <1 mg/L (ref 0–10)

## 2019-01-06 LAB — SEDIMENTATION RATE: Sed Rate: 6 mm/hr (ref 0–15)

## 2019-02-13 ENCOUNTER — Encounter: Payer: Self-pay | Admitting: Family Medicine

## 2019-02-13 ENCOUNTER — Ambulatory Visit (INDEPENDENT_AMBULATORY_CARE_PROVIDER_SITE_OTHER): Payer: Self-pay | Admitting: Family Medicine

## 2019-02-13 ENCOUNTER — Other Ambulatory Visit: Payer: Self-pay

## 2019-02-13 VITALS — BP 100/62 | HR 69 | Wt 183.4 lb

## 2019-02-13 DIAGNOSIS — R1011 Right upper quadrant pain: Secondary | ICD-10-CM

## 2019-02-13 DIAGNOSIS — R109 Unspecified abdominal pain: Secondary | ICD-10-CM

## 2019-02-13 LAB — POCT URINALYSIS DIP (MANUAL ENTRY)
Bilirubin, UA: NEGATIVE
Blood, UA: NEGATIVE
Glucose, UA: NEGATIVE mg/dL
Ketones, POC UA: NEGATIVE mg/dL
Leukocytes, UA: NEGATIVE
Nitrite, UA: NEGATIVE
Protein Ur, POC: NEGATIVE mg/dL
Spec Grav, UA: >=1.03 — AB (ref 1.010–1.025)
Urobilinogen, UA: 0.2 E.U./dL
pH, UA: 5.5 (ref 5.0–8.0)

## 2019-02-13 NOTE — Patient Instructions (Signed)
It was great seeing you again today!  Observe this to be having the abdominal pain.  Congratulations on improving your diet and eating much healthier.  I would continue to still take the Gas-X as it appears like that helps your pain more than anything.  Will be set up with a right upper quadrant ultrasound to look for gallbladder problems.  Also get a urinalysis to check for signs of a kidney stone.  I will give you a call with these results.

## 2019-02-15 NOTE — Progress Notes (Signed)
   HPI 38 year old male who presents for continued right upper quadrant abdominal pain.  Patient has had chronic abdominal pain for which I first saw him on 09/01/2018.  Originally the pain was felt to be consistent with bloating and GERD.  Patient was given omeprazole and told he could take simethicone for the gaseous pain.  He states that the simethicone actually helped his pain significantly.  He noted some blood in his stool in early July 2020.  Underwent lab work consisting of a CBC, CMP, sed rate, CRP along with a Hemoccult stool card.  All lab work looked completely normal and stool card was negative.  He states that the pain has now migrated to his right upper quadrant and right flank.  He states that it comes and goes especially prominent right before meals.  He has been having regular bowel movements.   CC: Abdominal pain   ROS:   Review of Systems See HPI for ROS.   CC, SH/smoking status, and VS noted  Objective: BP 100/62   Pulse 69   Wt 183 lb 6.4 oz (83.2 kg)   SpO2 98%   BMI 28.72 kg/m  Gen: 38 year old [place] male, no acute distress, resting comfortably HEENT: Moist mucous membranes, no cervical lymphadenopathy CV: Regular rate and rhythm, no M/R/G Resp: Clear to auscultation bilaterally, no sensory muscle use Abd: Soft, nontender, nondistended.  Negative Murphy sign. Neuro: Alert and oriented, Speech clear, No gross deficits Very minimal CVA tenderness palpation  Assessment and plan:  Abdominal pain Patient's abdominal pain seems to changed somewhat at this point.  Is now become right upper quadrant and right flank pain.  Get UA to evaluate urine to check for pyelonephritis or perhaps a kidney stone.  UA without any abnormality.  Patient symptoms of pain before eating, location, and the intermittent nature are suspicious for gallbladder pathology.  Schedule patient for right upper quadrant ultrasound.  He can continue taking the simethicone and omeprazole.    Orders Placed This Encounter  Procedures  . US Abdomen Limited RUQ    Standing Status:   Future    Standing Expiration Date:   04/14/2020    Order Specific Question:   Reason for Exam (SYMPTOM  OR DIAGNOSIS REQUIRED)    Answer:   abdominal pain    Order Specific Question:   Preferred imaging location?    Answer:   [facility]  . POCT urinalysis dipstick    No orders of the defined types were placed in this encounter.    Guadalupe Dawn MD PGY-3 Family Medicine Resident  02/17/2019 8:07 AM

## 2019-02-17 NOTE — Assessment & Plan Note (Signed)
Patient's abdominal pain seems to changed somewhat at this point.  Is now become right upper quadrant and right flank pain.  Get UA to evaluate urine to check for pyelonephritis or perhaps a kidney stone.  UA without any abnormality.  Patient symptoms of pain before eating, location, and the intermittent nature are suspicious for gallbladder pathology.  Schedule patient for right upper quadrant ultrasound.  He can continue taking the simethicone and omeprazole.

## 2019-02-18 ENCOUNTER — Other Ambulatory Visit: Payer: Self-pay

## 2019-02-18 ENCOUNTER — Ambulatory Visit (HOSPITAL_COMMUNITY)
Admission: RE | Admit: 2019-02-18 | Discharge: 2019-02-18 | Disposition: A | Discharge status: Home / Self Care | Payer: Self-pay | Source: Ambulatory Visit | Attending: Family Medicine | Admitting: Family Medicine

## 2019-02-18 DIAGNOSIS — R1011 Right upper quadrant pain: Secondary | ICD-10-CM | POA: Insufficient documentation

## 2019-02-20 ENCOUNTER — Other Ambulatory Visit: Payer: Self-pay

## 2019-02-20 ENCOUNTER — Ambulatory Visit (INDEPENDENT_AMBULATORY_CARE_PROVIDER_SITE_OTHER): Payer: Self-pay | Admitting: Family Medicine

## 2019-02-20 VITALS — BP 100/56 | HR 63 | Ht 67.0 in | Wt 182.4 lb

## 2019-02-20 DIAGNOSIS — R1011 Right upper quadrant pain: Secondary | ICD-10-CM

## 2019-02-20 NOTE — Progress Notes (Signed)
   Subjective:    Patient ID: Tommy Steele, male    DOB: [birth year], 38 y.o.   MRN: [id number]   CC: Right upper quadrant abdominal pain  HPI: Patient is a pleasant 38 year old man presenting with continued complaint of right upper quadrant abdominal pain for 3 months.  He was most recently sent for right upper quadrant abdominal ultrasound which was positive for gallbladder polyps, the largest measuring 4 mm.  The patient continues to have pain focalized to the right upper quadrant right before and during eating.  He is otherwise having normal stools that are brown in color.  He denies having any blood in his stools.  He also denies fevers, body aches, chills, shortness of breath, chest pain, and rashes.  Smoking status reviewed: Non-smoker  Review of Systems: See HPI   Objective:  BP (!) 100/56   Pulse 63   Ht 5\' 7"  (1.702 m)   Wt 182 lb 6 oz (82.7 kg)   PF 97 L/min   BMI 28.56 kg/m  Vitals and nursing note reviewed  General: well nourished, in no acute distress Neck: supple, non-tender, without lymphadenopathy Cardiac: RRR, clear S1 and S2, no murmurs appreciated Respiratory: CTA bilaterally, no increased work of breathing Abdomen: soft, nontender, nondistended, no masses or organomegaly. Bowel sounds present and normal, no CVA tenderness Skin: warm and dry, no rashes noted  Assessment & Plan:   Abdominal pain Right upper quadrant abdominal ultrasound significant for gallbladder polyps, might be contributing to patient's severe abdominal pain.  Patient is without insurance and therefore has limited options for surgical treatment of gallbladder pain. -Patient referred to general surgery at tertiary center  -Can continue omeprazole and simethicone PRN gastritis and gassiness   Return if symptoms worsen or fail to improve.   Dr. Milus Banister [facility] [place] [facility], PGY-2

## 2019-02-20 NOTE — Assessment & Plan Note (Signed)
Right upper quadrant abdominal ultrasound significant for gallbladder polyps, might be contributing to patient's severe abdominal pain.  Patient is without insurance and therefore has limited options for surgical treatment of gallbladder pain. -Patient referred to general surgery at tertiary center  -Can continue omeprazole and simethicone PRN gastritis and gassiness

## 2019-02-20 NOTE — Patient Instructions (Addendum)
Thank you for coming in to see [place] today! Please see below to review our plan for today's visit:  1. Your abdominal ultrasound showed Gallbladder Polyps (the biggest measured 4 mm). Because you don't have insurance it will be difficult to get the gallbladder removed in this area. Reach out to local insurance groups ([place], [place], [employer], Social research officer, government) to try to get insurance. 2. I have referred you to a general surgeon at a "tertiary center", we will see if they will take you on as a patient.  3. You can take Tylenol 500mg  every 6 hours for stomach pains. Continue the Gas-X, take Omeprazole daily as needed for gastritis and stomach pains.   Please call the clinic at [phone] if your symptoms worsen or you have any concerns. It was our pleasure to serve you!    Dr. Milus Banister [facility]

## 2019-02-27 ENCOUNTER — Encounter: Payer: Self-pay | Admitting: Gastroenterology

## 2019-03-23 ENCOUNTER — Other Ambulatory Visit: Payer: Self-pay

## 2019-03-23 DIAGNOSIS — Z20822 Contact with and (suspected) exposure to covid-19: Secondary | ICD-10-CM

## 2019-03-24 LAB — NOVEL CORONAVIRUS, NAA: SARS-CoV-2, NAA: DETECTED — AB

## 2019-03-31 ENCOUNTER — Ambulatory Visit: Payer: Self-pay | Admitting: Gastroenterology

## 2019-04-28 ENCOUNTER — Other Ambulatory Visit: Payer: Self-pay

## 2019-04-28 ENCOUNTER — Ambulatory Visit: Payer: Self-pay | Admitting: Gastroenterology

## 2019-04-28 ENCOUNTER — Encounter: Payer: Self-pay | Admitting: Gastroenterology

## 2019-04-28 VITALS — BP 116/68 | HR 58 | Temp 97.8°F | Ht 67.0 in | Wt 188.6 lb

## 2019-04-28 DIAGNOSIS — R109 Unspecified abdominal pain: Secondary | ICD-10-CM

## 2019-04-28 NOTE — Progress Notes (Signed)
HPI: This is a very pleasant 38 year old man who was referred to me by Lind Covert, *  to evaluate abdominal pain.    Chief complaint is upper abdominal pain  For about 3 months he has had upper abdominal pains.  They are in the epigastrium, also in the right upper quadrant and left upper quadrant.  Pains are intermittent.  They sometimes were happening after he would eat.  They were brief.  He was evaluated at 1 point with an abdominal ultrasound 2 or 3 months ago and it suggested that he had several gallbladder polyps.  It was explained to him that this was the source of his right upper quadrant pains but it did not explain his left upper quadrant pains and so that is why he is here.  No changes in his bowels.  He does have some cramps at times.  No vomiting no diarrhea.  He is not taking any medicines.  He tried a brief course of proton pump inhibitor and it did not really make much difference.  He ended up changing his diet significantly, he cut out candy, cut fried foods.  He has lost about 30 pounds since making these significant dietary changes and he is feeling overall much better.    Old Data Reviewed: Blood work July 2020 shows normal CBC, normal complete metabolic profile, normal sed rate and CRP.  Covid + September 2020  Abdominal ultrasound right upper quadrant, done for right upper quadrant pain August 2020 showed "several tiny gallbladder polyps largest 4 mm diameter".    Review of systems: Pertinent positive and negative review of systems were noted in the above HPI section. All other review negative.   History reviewed. No pertinent past medical history.  History reviewed. No pertinent surgical history.  Current Outpatient Medications  Medication Sig Dispense Refill  . Simethicone (GAS-X PO) Take by mouth.     No current facility-administered medications for this visit.     Allergies as of 04/28/2019  . (No Known Allergies)    Family History   Problem Relation Age of Onset  . Liver cancer Maternal Grandmother   . Cancer Paternal Grandmother   . Hypertension Mother   . Stomach cancer Neg Hx   . Colon cancer Neg Hx     Social History   Socioeconomic History  . Marital status: Married    Spouse name: Not on file  . Number of children: Not on file  . Years of education: Not on file  . Highest education level: Not on file  Occupational History  . Not on file  Social Needs  . Financial resource strain: Not on file  . Food insecurity    Worry: Not on file    Inability: Not on file  . Transportation needs    Medical: Not on file    Non-medical: Not on file  Tobacco Use  . Smoking status: Never Smoker  . Smokeless tobacco: Never Used  Substance and Sexual Activity  . Alcohol use: Never    Frequency: Never  . Drug use: Never  . Sexual activity: Yes    Partners: Female    Birth control/protection: Condom  Lifestyle  . Physical activity    Days per week: Not on file    Minutes per session: Not on file  . Stress: Not on file  Relationships  . Social Herbalist on phone: Not on file    Gets together: Not on file    Attends religious  service: Not on file    Active member of club or organization: Not on file    Attends meetings of clubs or organizations: Not on file    Relationship status: Not on file  . Intimate partner violence    Fear of current or ex partner: Not on file    Emotionally abused: Not on file    Physically abused: Not on file    Forced sexual activity: Not on file  Other Topics Concern  . Not on file  Social History Narrative  . Not on file     Physical Exam: BP 116/68   Pulse (!) 58   Temp 97.8 F (36.6 C)   Ht 5\' 7"  (1.702 m)   Wt 188 lb 9.6 oz (85.5 kg)   BMI 29.54 kg/m  Constitutional: generally well-appearing Psychiatric: alert and oriented x3 Eyes: extraocular movements intact Mouth: oral pharynx moist, no lesions Neck: supple no lymphadenopathy Cardiovascular:  heart regular rate and rhythm Lungs: clear to auscultation bilaterally Abdomen: soft, nontender, nondistended, no obvious ascites, no peritoneal signs, normal bowel sounds Extremities: no lower extremity edema bilaterally Skin: no lesions on visible extremities   Assessment and plan: 38 y.o. male with upper abdominal pains  First it was explained to him that his right upper quadrant pains were related to gallbladder polyps.  I am not sure that that is really the case since the pain seem to be easing off with dietary changes.  He does still intermittently have upper abdominal pains, they can be right upper quadrant, left upper quadrant, epigastric.  They are usually intermittent and brief.  They are almost always relieved with Gas-X.  Reassured him there is unlikely anything serious going on but I recommended H. pylori stool antigen testing and will treat with appropriate antibiotics if positive.    Please see the "Patient Instructions" section for addition details about the plan.   Owens Loffler, MD [facility] 04/28/2019, 11:15 AM  Cc: Lind Covert, *

## 2019-04-28 NOTE — Patient Instructions (Signed)
Your provider has requested that you go to the basement level for lab work before leaving today. Press "B" on the elevator. The lab is located at the first door on the left as you exit the elevator.  Thank you for entrusting me with your care and choosing [facility].  Dr Jacobs  

## 2019-05-05 ENCOUNTER — Other Ambulatory Visit: Payer: Self-pay

## 2019-05-05 DIAGNOSIS — K921 Melena: Secondary | ICD-10-CM

## 2019-05-05 DIAGNOSIS — R109 Unspecified abdominal pain: Secondary | ICD-10-CM

## 2019-05-06 LAB — HELICOBACTER PYLORI  SPECIAL ANTIGEN
MICRO NUMBER:: 1059485
SPECIMEN QUALITY: ADEQUATE

## 2019-05-20 ENCOUNTER — Other Ambulatory Visit: Payer: Self-pay

## 2019-05-20 ENCOUNTER — Ambulatory Visit (INDEPENDENT_AMBULATORY_CARE_PROVIDER_SITE_OTHER): Payer: Self-pay | Admitting: Family Medicine

## 2019-05-20 VITALS — BP 100/58 | HR 65 | Ht 67.0 in | Wt 192.4 lb

## 2019-05-20 DIAGNOSIS — R1084 Generalized abdominal pain: Secondary | ICD-10-CM

## 2019-05-20 MED ORDER — DICYCLOMINE HCL 20 MG PO TABS: 20.0000 mg | ORAL_TABLET | Freq: Three times a day (TID) | ORAL | 1 refills | Status: AC

## 2019-05-20 NOTE — Progress Notes (Signed)
  Subjective:   Patient ID: Tommy Steele    DOB: 1981-02-07, 38 y.o. male   MRN: [id number]  Tommy Steele is a 38 y.o. male with a history of dyspepsia here for   Abdominal Pain - previously seen 01/05/2019 for multiple episodes of BRBPR and again 02/20/2019 for abdominal pain, RUQ [place] with gallbladder polyps (largest 61m), referred to surgery at tertiary center. Normal CMP, CBC, ESR, CRP at that time. - Seen by GI 04/28/19 who did not feel GB polyps were source of pain. Recommended H pylori testing, resulted negative. - COVID+ 03/2019  Pain began 6 months ago Medications tried: PPI (d/ced by GI), GasX (helped, taking with every meal). Also tried diet changes (salad, grilled chicken), helped some. Prior abdominal surgeries: no Pain described as: diffuse (RUQ, LUQ, epigastric, LLQ), sharp, intermittent, sometimes occurs during sleep.  Symptoms Nausea/vomiting: no Diarrhea: no  Constipation: no Blood in stool: no, now resolved Blood in vomit: n/a Fever: no Dysuria: no Loss of appetite: some Weight loss: not recently 200s in 2019,   Review of Systems:  Per HPI.  Medications and smoking status reviewed.  Objective:   BP (!) 100/58   Pulse 65   Ht '5\' 7"'$  (1.702 m)   Wt 192 lb 6 oz (87.3 kg)   SpO2 99%   BMI 30.13 kg/m  Vitals and nursing note reviewed.  General: well nourished, well developed, in no acute distress with non-toxic appearance HEENT: normocephalic, atraumatic, moist mucous membranes CV: regular rate and rhythm without murmurs, rubs, or gallops Lungs: clear to auscultation bilaterally with normal work of breathing Abdomen: soft, non-tender, non-distended, no masses or organomegaly palpable, normoactive bowel sounds.  Negative Murphy sign. Skin: warm, dry, no rashes or lesions  Assessment & Plan:   Abdominal pain Diffuse and persistent but with some relief from [place] Given gallbladder polyps are tiny with largest 4 mm in absence of gallstones, likely  not etiology of pain.  H. pylori negative.  Prior CMP, CBC, H. pylori testing negative.  Did test positive for Covid 03/2019 but did not have many symptoms throughout that course so likely noncontributory.  Vitals within normal limits today with benign abdominal exam.  Given diffuse location and character of pain, most likely etiology would be functional such as IBS.  Will trial dicyclomine and peppermint oil with follow-up virtually in 4 weeks.  Patient also has follow-up with GI in 2 weeks.  Return precautions discussed and patient will return for care if develops nausea, vomiting, worsened abdominal pain, fevers.  No orders of the defined types were placed in this encounter.  Meds ordered this encounter  Medications  . dicyclomine (BENTYL) 20 MG tablet    Sig: Take 1 tablet (20 mg total) by mouth 4 (four) times daily -  before meals and at bedtime.    Dispense:  120 tablet    Refill:  1[place] DO PGY-3, [facility] 05/20/2019 10:05 AM

## 2019-05-20 NOTE — Assessment & Plan Note (Signed)
Diffuse and persistent but with some relief from [place]. Given gallbladder polyps are tiny with largest 4 mm in absence of gallstones, likely not etiology of pain.  H. pylori negative.  Prior CMP, CBC, H. pylori testing negative.  Did test positive for Covid 03/2019 but did not have many symptoms throughout that course so likely noncontributory.  Vitals within normal limits today with benign abdominal exam.  Given diffuse location and character of pain, most likely etiology would be functional such as IBS.  Will trial dicyclomine and peppermint oil with follow-up virtually in 4 weeks.  Patient also has follow-up with GI in 2 weeks.  Return precautions discussed and patient will return for care if develops nausea, vomiting, worsened abdominal pain, fevers.

## 2019-05-20 NOTE — Patient Instructions (Signed)
It was great to see you!  Our plans for today:  - It is not likely your pain is coming from your gallbladder. Taking your gallbladder out may not fix your problem. It is likely your pain is functional, coming from overactivation of nerves in your GI tract. We are starting a new medication to help this called dicyclomine. - Peppermint oil also may help your pain. You can get this on [employer] or at [place], it is typically about $10. Get the [employer] brand. - Schedule a virtual visit in 4 weeks to see how you are doing.  - come back if you experience nausea, vomiting, worsened abdominal pain, blood in urine or stool.  Take care and seek immediate care sooner if you develop any concerns.   Dr. [facility]

## 2019-06-03 ENCOUNTER — Ambulatory Visit (INDEPENDENT_AMBULATORY_CARE_PROVIDER_SITE_OTHER): Payer: Self-pay | Admitting: Physician Assistant

## 2019-06-03 ENCOUNTER — Encounter: Payer: Self-pay | Admitting: Physician Assistant

## 2019-06-03 VITALS — BP 137/80 | HR 76 | Temp 98.0°F | Ht 70.0 in | Wt 189.8 lb

## 2019-06-03 DIAGNOSIS — R109 Unspecified abdominal pain: Secondary | ICD-10-CM

## 2019-06-03 MED ORDER — DICYCLOMINE HCL 10 MG PO CAPS: 10.0000 mg | ORAL_CAPSULE | Freq: Four times a day (QID) | ORAL | 3 refills | Status: AC | PRN

## 2019-06-03 NOTE — Patient Instructions (Signed)
If you are age 38 or older, your body mass index should be between 23-30. Your Body mass index is 27.23 kg/m. If this is out of the aforementioned range listed, please consider follow up with your Primary Care Provider.  If you are age 12 or younger, your body mass index should be between 19-25. Your Body mass index is 27.23 kg/m. If this is out of the aformentioned range listed, please consider follow up with your Primary Care Provider.   You have been scheduled for a CT scan of the abdomen and pelvis at [facility].  You are scheduled on 06/10/19 at 1:30 pm. You should arrive 15 minutes prior to your appointment time for registration. Please follow the written instructions below on the day of your exam:  WARNING: IF YOU ARE ALLERGIC TO IODINE/X-RAY DYE, PLEASE NOTIFY RADIOLOGY IMMEDIATELY AT [phone]! YOU WILL BE GIVEN A 13 HOUR PREMEDICATION PREP.  1) Do not eat or drink anything after 9:30 am (4 hours prior to your test) 2) You have been given 2 bottles of oral contrast to drink. The solution may taste better if refrigerated, but do NOT add ice or any other liquid to this solution. Shake well before drinking.    Drink 1 bottle of contrast @ 11:30 am (2 hours prior to your exam)  Drink 1 bottle of contrast @ 12:30 pm (1 hour prior to your exam)  You may take any medications as prescribed with a small amount of water, if necessary. If you take any of the following medications: METFORMIN, GLUCOPHAGE, GLUCOVANCE, AVANDAMET, RIOMET, FORTAMET, [place] MET, JANUMET, GLUMETZA or METAGLIP, you MAY be asked to HOLD this medication 48 hours AFTER the exam.  The purpose of you drinking the oral contrast is to aid in the visualization of your intestinal tract. The contrast solution may cause some diarrhea. Depending on your individual set of symptoms, you may also receive an intravenous injection of x-ray contrast/dye. Plan on being at [facility] for 30 minutes or longer, depending  on the type of exam you are having performed.  This test typically takes 30-45 minutes to complete.  If you have any questions regarding your exam or if you need to reschedule, you may call the CT department at [phone] between the hours of 8:00 am and 5:00 pm, Monday-Friday. _____________________________________________________________  We have sent the following medications to your pharmacy for you to pick up at your convenience: Bentyl  Take Gas-X with meals.  (this is over-the-counter)  AVOID LACTOSE.  We will call you with results.  Thank you for choosing me and [facility].   Amy Esterwood, PA-C

## 2019-06-04 ENCOUNTER — Encounter: Payer: Self-pay | Admitting: Physician Assistant

## 2019-06-04 NOTE — Progress Notes (Signed)
Subjective:    Patient ID: Tommy Steele, male    DOB: Nov 16, 1980, 38 y.o.   MRN: [id number]  HPI Tommy Steele is a pleasant 38 year old male, established recently with Dr. Ardis Hughs and last seen in the office on April 28, 2019.  At that time he was complaining of 55-month history of upper abdominal pain.  He had had an ultrasound in August 2020 which showed several tiny gallbladder polyps, the largest 4 mm, but no gallstones or gallbladder wall thickening. H. pylori stool antigen was checked and was negative.  When he was seen in the office he was actually feeling better, was asked to continue to use Gas-X as needed and no further work-up pursued as he was better. Comes back in today stating that he has been having some right upper abdominal pain and left mid and upper abdominal pain both which seem to be intermittent and more prevalent after eating.  He says symptoms have been going on for several months.  He is also having very frequent bloating after meals and fullness in the mid abdomen.  He had been given a trial of dicyclomine by his PCP and that seemed to be helping some.  He also continue to use Gas-X as needed.  His appetite has been good, his weight has been stable, no nausea or vomiting and bowel movements fairly normal. He is lactose intolerant and tries to avoid most lactose altogether.  He does not use any artificial sweeteners.  No regular aspirin or NSAIDs.  Patient says he worries about his abdominal pain because he had 2 cousins die from cancer at a young age.  Review of Systems Pertinent positive and negative review of systems were noted in the above HPI section.  All other review of systems was otherwise negative.  Outpatient Encounter Medications as of 06/03/2019  Medication Sig  . dicyclomine (BENTYL) 20 MG tablet Take 1 tablet (20 mg total) by mouth 4 (four) times daily -  before meals and at bedtime.  . peppermint oil liquid by Does not apply route daily.  . Simethicone  (GAS-X PO) Take by mouth.  . dicyclomine (BENTYL) 10 MG capsule Take 1 capsule (10 mg total) by mouth every 6 (six) hours as needed for spasms (abdominal cramping).   No facility-administered encounter medications on file as of 06/03/2019.    No Known Allergies Patient Active Problem List   Diagnosis Date Noted  . Bloody stool 01/05/2019  . Establishing care with new doctor, encounter for 09/05/2018  . Lipoma 09/05/2018  . Abdominal pain 09/05/2018   Social History   Socioeconomic History  . Marital status: Married    Spouse name: Not on file  . Number of children: Not on file  . Years of education: Not on file  . Highest education level: Not on file  Occupational History  . Not on file  Social Needs  . Financial resource strain: Not on file  . Food insecurity    Worry: Not on file    Inability: Not on file  . Transportation needs    Medical: Not on file    Non-medical: Not on file  Tobacco Use  . Smoking status: Never Smoker  . Smokeless tobacco: Never Used  Substance and Sexual Activity  . Alcohol use: Never    Frequency: Never  . Drug use: Never  . Sexual activity: Yes    Partners: Female    Birth control/protection: Condom  Lifestyle  . Physical activity    Days per week:  Not on file    Minutes per session: Not on file  . Stress: Not on file  Relationships  . Social Herbalist on phone: Not on file    Gets together: Not on file    Attends religious service: Not on file    Active member of club or organization: Not on file    Attends meetings of clubs or organizations: Not on file    Relationship status: Not on file  . Intimate partner violence    Fear of current or ex partner: Not on file    Emotionally abused: Not on file    Physically abused: Not on file    Forced sexual activity: Not on file  Other Topics Concern  . Not on file  Social History Narrative  . Not on file    Mr. Phillipson's family history includes Cancer in his paternal  grandmother; Hypertension in his mother; Liver cancer in his maternal grandmother.      Objective:    Vitals:   06/03/19 1352  BP: 137/80  Pulse: 76  Temp: 98 F (36.7 C)  SpO2: 100%    Physical Exam Well-developed well-nourished male  in no acute distress.  Height, [id number], BMI 27.3  HEENT; nontraumatic normocephalic, EOMI, PER R LA, sclera anicteric. Oropharynx; not done/mask/Covid Neck; supple, no JVD Cardiovascular; regular rate and rhythm with S1-S2, no murmur rub or gallop Pulmonary; Clear bilaterally Abdomen; soft,  nondistended, he is tender in the left mid quadrant, no guarding, no palpable mass or hepatosplenomegaly, bowel sounds are active Rectal; not done today Skin; benign exam, no jaundice rash or appreciable lesions Extremities; no clubbing cyanosis or edema skin warm and dry Neuro/Psych; alert and oriented x4, grossly nonfocal mood and affect appropriate       Assessment & Plan:   #59 38 year old male with several month history of right upper, left upper and left mid quadrant abdominal pain, intermittent and associated with bloating and generally with increased symptoms postprandially. Etiology is not entirely clear, suspect IBS but need to rule out other intra-abdominal inflammatory process, IBD.  #2 small gallbladder polyps #3 lactose intolerance  Plan; continue dicyclomine 10 mg p.o. every 6 hours as needed, refills sent Continue Gas-X, asked him to take 1-2 before each meal Schedule for CT of the abdomen and pelvis with contrast. Further plans pending results of above.  Amy Genia Harold PA-C 06/04/2019   Cc: Guadalupe Dawn, MD

## 2019-06-05 NOTE — Progress Notes (Signed)
I agree with the above note, plan 

## 2019-06-10 ENCOUNTER — Ambulatory Visit (HOSPITAL_COMMUNITY)
Admission: RE | Admit: 2019-06-10 | Discharge: 2019-06-10 | Disposition: A | Discharge status: Home / Self Care | Payer: Self-pay | Source: Ambulatory Visit | Attending: Physician Assistant | Admitting: Physician Assistant

## 2019-06-10 ENCOUNTER — Other Ambulatory Visit: Payer: Self-pay

## 2019-06-10 DIAGNOSIS — R109 Unspecified abdominal pain: Secondary | ICD-10-CM | POA: Insufficient documentation

## 2019-06-10 MED ORDER — IOHEXOL 300 MG/ML  SOLN
100.0000 mL | Freq: Once | INTRAMUSCULAR | Status: AC | PRN
  Administered 2019-06-10: 100 mL via INTRAVENOUS

## 2019-06-10 MED ORDER — SODIUM CHLORIDE (PF) 0.9 % IJ SOLN
INTRAMUSCULAR | Status: AC
  Filled 2019-06-10: qty 50

## 2019-06-11 NOTE — Progress Notes (Signed)
Please let pt know the CT scan does not show  any explanation for his right-sided abdominal pain.  He does have a mildly enlarged prostate.  As far as his GI symptoms are concerned, we can offer Colonoscopy if he would like to pursue that-Would schedule with Dr. Ardis Hughs.

## 2020-04-01 IMAGING — CT CT ABD-PELV W/ CM
2 of 4 series · 16 of 46 positions shown, 18 images · IV contrast (APPLIED)
Comparison: Ultrasound 02/18/2019

CLINICAL DATA: Right upper quadrant epigastric pain intermittently
for the last 7 months. Small gallbladder polyps on ultrasound of
02/18/2019

EXAM:
CT ABDOMEN AND PELVIS WITH CONTRAST
TECHNIQUE: Multidetector CT imaging of the abdomen and pelvis was performed
using the standard protocol following bolus administration of
intravenous contrast.
CONTRAST:  100mL OMNIPAQUE IOHEXOL 300 MG/ML  SOLN

[Series 2: axial st · axial · 0.75mm/px · z∈[-520,-90]mm · 13 of 96 slices shown, 15 images]
[im 5/96  soft-tissue]
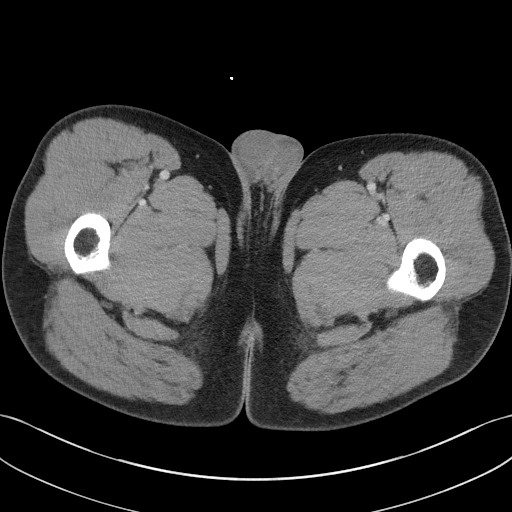
[im 5/96  bone]
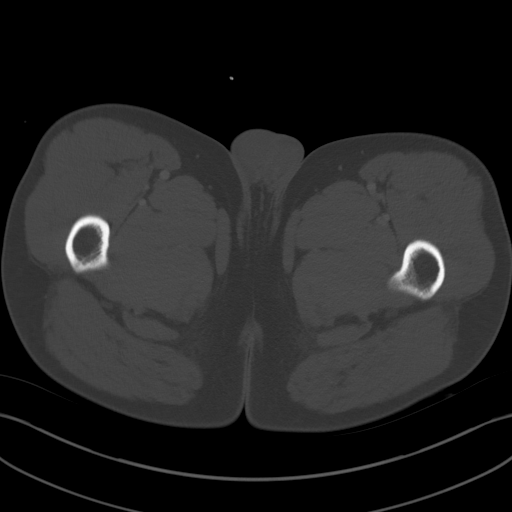
[im 15/96  soft-tissue]
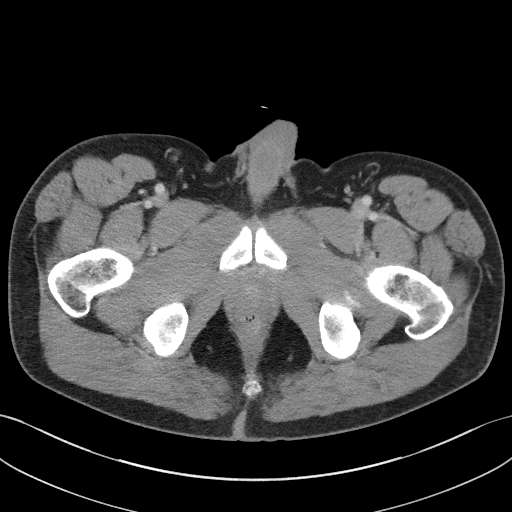
[im 20/96  soft-tissue]
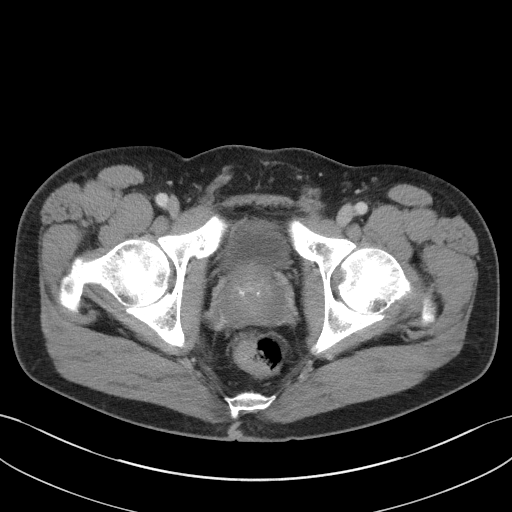
[im 29/96  soft-tissue]
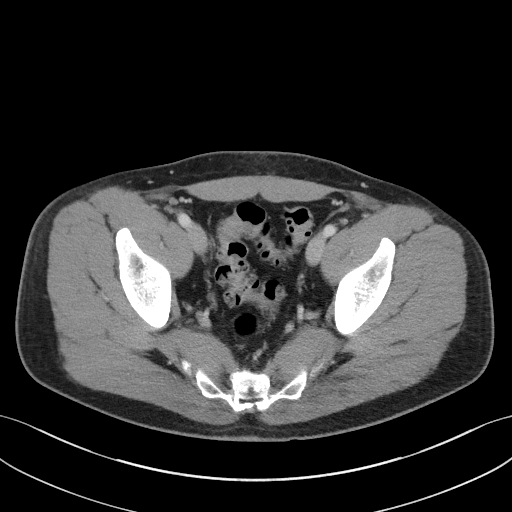
[im 34/96  soft-tissue]
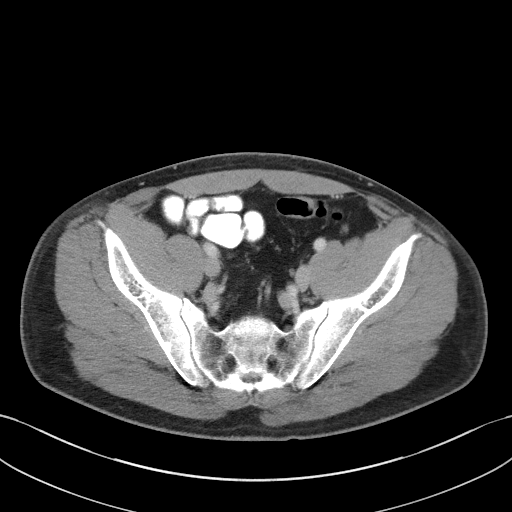
[im 43/96  soft-tissue]
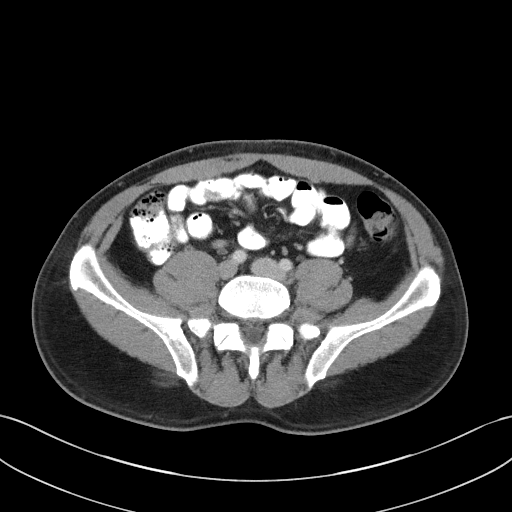
[im 48/96  soft-tissue]
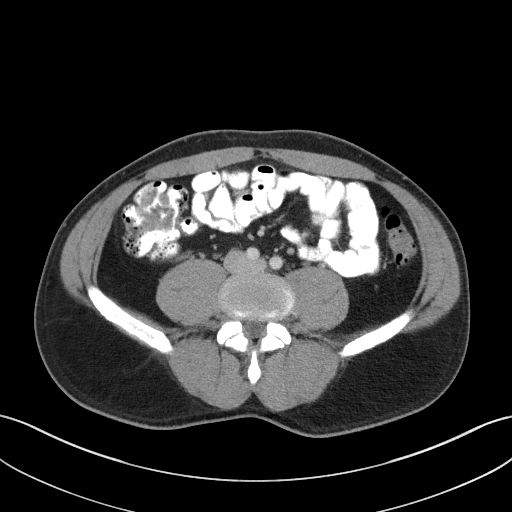
[im 53/96  soft-tissue]
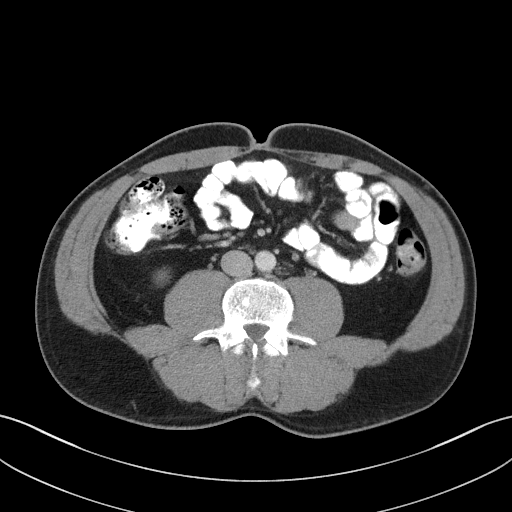
[im 62/96  soft-tissue]
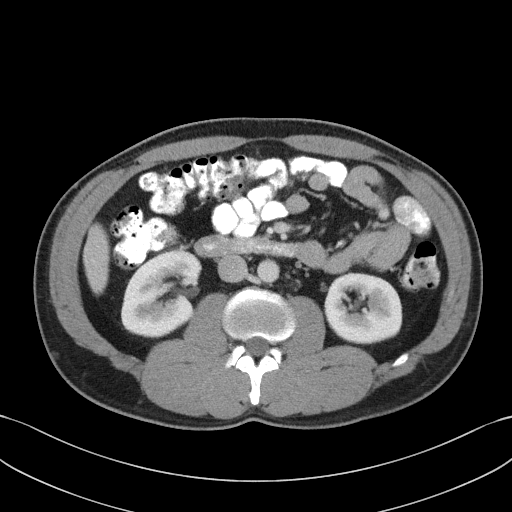
[im 62/96  bone]
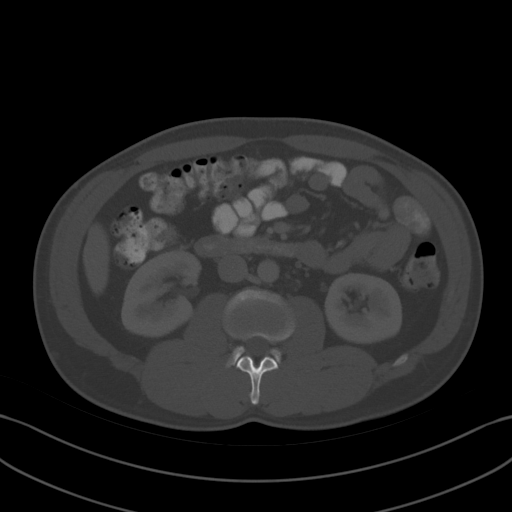
[im 67/96  soft-tissue]
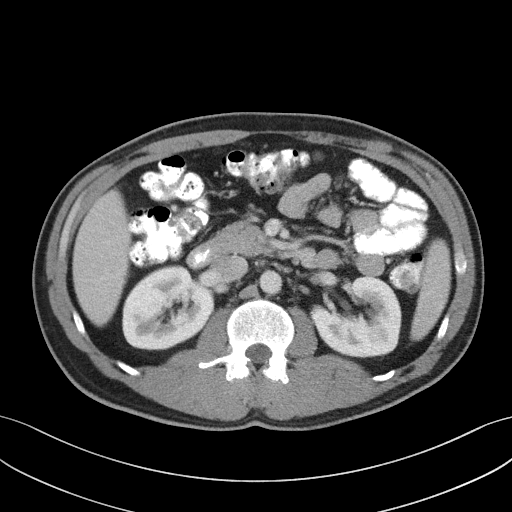
[im 77/96  soft-tissue]
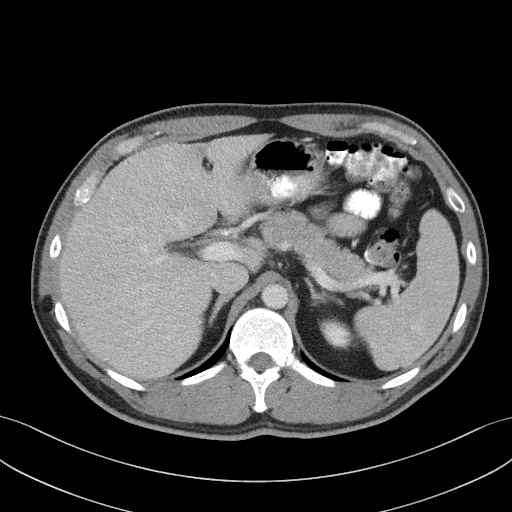
[im 81/96  soft-tissue]
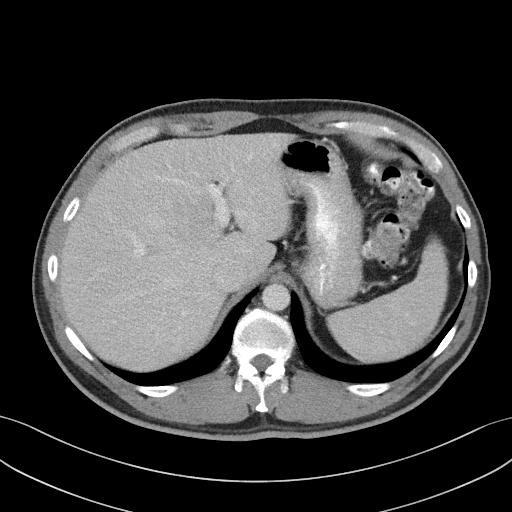
[im 91/96  soft-tissue]
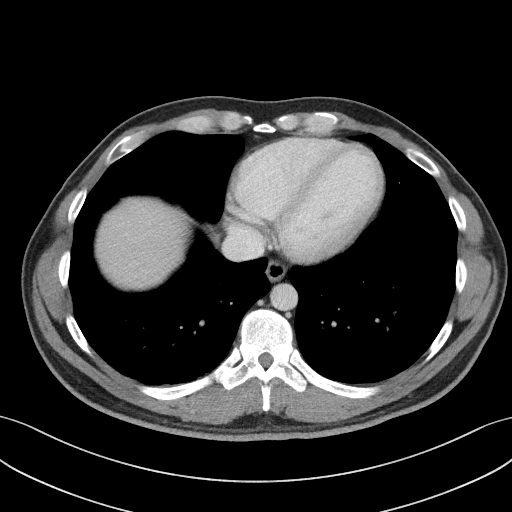

[Series 5: coronal st · coronal · 0.75mm/px · 3 of 86 slices shown]
[im 29/86  soft-tissue]
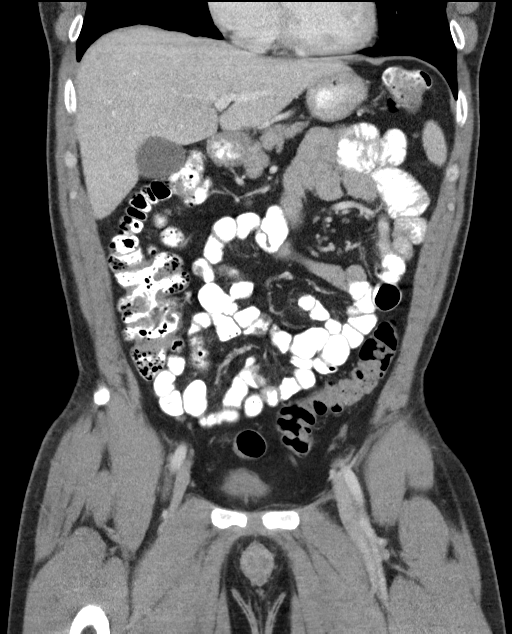
[im 38/86  soft-tissue]
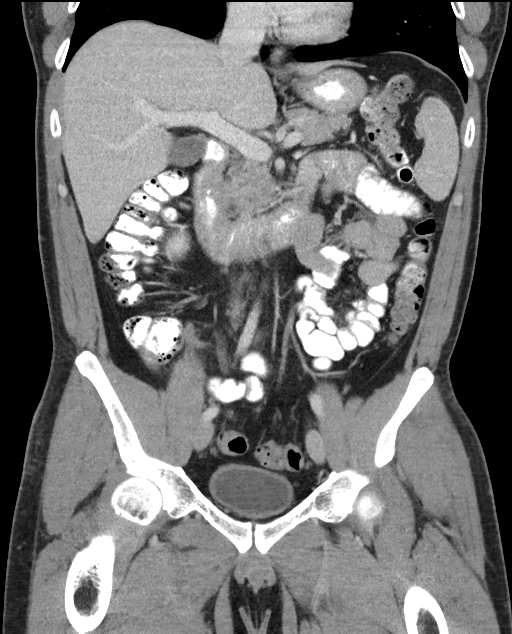
[im 48/86  soft-tissue]
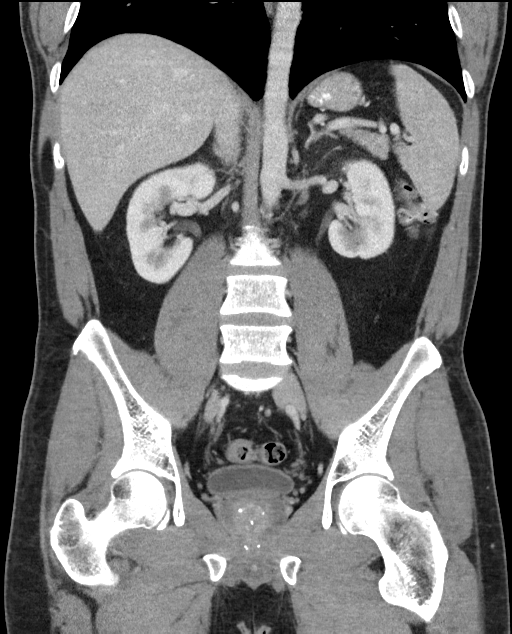

[16 of 46 positions shown; findings below may reference images not displayed]

FINDINGS: Lower chest: Unremarkable

Hepatobiliary: Unremarkable they were the small gallbladder polyp
seen on ultrasound are not readily apparent on today's CT
examination.

Pancreas: Unremarkable

Spleen: Unremarkable

Adrenals/Urinary Tract: Linear calcification along the medial limb
of the right adrenal gland without surrounding soft tissue lesion,
compatible with remote inflammation. Both kidneys appear normal.
Urinary bladder unremarkable.

Stomach/Bowel: Unremarkable

Vascular/Lymphatic: Unremarkable

Reproductive: The prostate gland measures 5.3 by 4.7 by 4.9 cm
(volume = 64 cm^3), compatible with mild prostatomegaly.
Curvilinear calcifications along the transition zone.

Other: No supplemental non-categorized findings.

Musculoskeletal: Unremarkable
IMPRESSION: 1. A cause for the patient's right upper quadrant abdominal pain is
not identified.
2. Mild prostatomegaly.
3. Linear calcification along the medial limb of the right adrenal
gland without surrounding soft tissue lesion, compatible with remote
inflammation.

## 2020-05-09 ENCOUNTER — Other Ambulatory Visit: Payer: Self-pay

## 2020-05-09 ENCOUNTER — Ambulatory Visit (HOSPITAL_COMMUNITY)
Admission: RE | Admit: 2020-05-09 | Discharge: 2020-05-09 | Disposition: A | Discharge status: Home / Self Care | Payer: Self-pay | Source: Ambulatory Visit | Attending: Family Medicine | Admitting: Family Medicine

## 2020-05-09 ENCOUNTER — Ambulatory Visit (INDEPENDENT_AMBULATORY_CARE_PROVIDER_SITE_OTHER): Payer: Self-pay | Admitting: Family Medicine

## 2020-05-09 ENCOUNTER — Encounter: Payer: Self-pay | Admitting: Family Medicine

## 2020-05-09 VITALS — BP 124/62 | HR 66 | Ht 67.0 in | Wt 205.0 lb

## 2020-05-09 DIAGNOSIS — D179 Benign lipomatous neoplasm, unspecified: Secondary | ICD-10-CM

## 2020-05-09 DIAGNOSIS — M79644 Pain in right finger(s): Secondary | ICD-10-CM | POA: Insufficient documentation

## 2020-05-09 NOTE — Assessment & Plan Note (Addendum)
Patient with right thumb injury 1 month ago. Continued pain and limited range of motion (with adduction and opposition) since then. Was unable to palpate any loose bodies on examination today though patient does describe feeling these at base of thumb from time to time. Also with change in sensation to posterior aspect of thumb on palpation of lateral thumb. Pulses intact, no obvious deformities or notable swelling. Will need to assess for fracture, especially given tenderness to anatomic snuffbox and base of thumb. Scaphoid fracture should appear on x-ray, if present, since injury was 1 month ago. Discussed pain control with Tylenol until fracture is ruled out. Patient may need hand referral pending x-ray results. If x-ray negative for fracture could consider referral to sports medicine for further management/work-up for this injury. Also discussed with patient about getting a thumb spica splint as his splint just barely goes over his wrist.

## 2020-05-09 NOTE — Progress Notes (Addendum)
    SUBJECTIVE:   CHIEF COMPLAINT / HPI:   Thumb Pain Patient presents c/o 1 month of right thumb pain after injury at work. Patient reports that he hit his hand on the wooden part of a shovel. Right after the injury he used hot water with salt over the injured area. He noticed swelling at the site and has had continued pain despite wearing a thumb brace for the last two weeks. He is right-handed and this is making it difficult for him to work since his job requires a lot of fine grip. Patient also states that he has felt some loose areas and is concerned for possible fracture. He has not used any medication for pain. He denies previous injury to the area.   Lipoma Patient briefly mentions that he has several bumps on his arms, torso and legs. In the past he has been told these are lipomas. He received referral in the past for dermatology but states it was difficult to get an appointment due to [place]. Denies pain to the areas, no increased growth. He declines repeat referral to Dermatology.  PERTINENT  PMH / PSH: No past medical history on file.   OBJECTIVE:   BP 124/62   Pulse 66   Ht 5\' 7"  (1.702 m)   Wt 205 lb (93 kg)   SpO2 99%   BMI 32.11 kg/m   General: pleasant, in no acute distress Cardio: RRR, no murmurs Pulm: CTAB, no wheezing, rhonchi or rales. Good air movement MSK: Full active and passive ROM in left thumb.  -Right thumb: decreased adduction and opposition. Pain on palpation at  snuff box and base of thumb. Varus and vagus stress tests intact and without  pain. No obvious deformities noted. No swelling. 2+ radial pulse. Patient reported  Numbness at posterior aspect of thumb on palpation of lateral thumb. Skin: Two 0.5-0.5cm lipomas felt on anterior right forearm. Moveable, non-tender. Larger lipoma felt to right flank, about 3 cm x 3 cm.   ASSESSMENT/PLAN:   Thumb pain, right Patient with right thumb injury 1 month ago. Continued pain and limited range of motion (with  adduction and opposition) since then. Was unable to palpate any loose bodies on examination today though patient does describe feeling these at base of thumb from time to time. Also with change in sensation to posterior aspect of thumb on palpation of lateral thumb. Pulses intact, no obvious deformities or notable swelling. Will need to assess for fracture, especially given tenderness to anatomic snuffbox and base of thumb. Scaphoid fracture should appear on x-ray, if present, since injury was 1 month ago. Discussed pain control with Tylenol until fracture is ruled out. Patient may need hand referral pending x-ray results. If x-ray negative for fracture could consider referral to sports medicine for further management/work-up for this injury. Also discussed with patient about getting a thumb spica splint as his splint just barely goes over his wrist.  Lipoma Patient briefly mentioned his nodules at conclusion of visit today. They are soft, movable and non-tender- consistent with lipomas. Patient has several throughout body. No increased growth or tenderness. Patient declined another referral to dermatology today as these do not bother him cosmetically.    Patient declined Flu vaccination today.  Sharion Settler, [place]

## 2020-05-09 NOTE — Assessment & Plan Note (Signed)
Patient briefly mentioned his nodules at conclusion of visit today. They are soft, movable and non-tender- consistent with lipomas. Patient has several throughout body. No increased growth or tenderness. Patient declined another referral to dermatology today as these do not bother him cosmetically.

## 2020-05-09 NOTE — Patient Instructions (Addendum)
It was wonderful to see you today! I'm sorry that you are dealing with this.  I have ordered x-rays at [facility]. They will be of your wrist and thumb. Use Tylenol for pain, you can use 650 mg every 4-6 hours. Do not use Ibuprofen until I receive the results of the x-ray.  I will call you with the results.   I would recommend using a bigger brace and trying to keep the area immobilized until the x-ray results come back as well.  If you change your mind about the Flu vaccine, we would be happy to provide it.  Thank you for choosing [facility].   Please call [phone] with any questions about today's appointment.  Schedule a follow up for 2 weeks.   Take care!  Sharion Settler, DO PGY-1 Family Medicine

## 2020-05-10 ENCOUNTER — Other Ambulatory Visit: Payer: Self-pay | Admitting: Family Medicine

## 2020-05-10 DIAGNOSIS — S62211A Bennett's fracture, right hand, initial encounter for closed fracture: Secondary | ICD-10-CM

## 2020-05-10 NOTE — Progress Notes (Signed)
Order placed for ortho referral. Patient with mildly displaced intra-articular fracture at the base of the first metacarpal (Bennett fracture) with some early callus formation at the fracture site.

## 2020-05-17 ENCOUNTER — Encounter: Payer: Self-pay | Admitting: Orthopaedic Surgery

## 2020-05-17 ENCOUNTER — Ambulatory Visit: Payer: Self-pay | Admitting: Orthopaedic Surgery

## 2020-05-17 VITALS — Ht 67.0 in | Wt 205.0 lb

## 2020-05-17 DIAGNOSIS — S62211A Bennett's fracture, right hand, initial encounter for closed fracture: Secondary | ICD-10-CM

## 2020-05-17 NOTE — Progress Notes (Signed)
Office Visit Note   Patient: Tommy Steele           Date of Birth: 07/17/1980           MRN: [id number] Visit Date: 05/17/2020              Requested by: McDiarmid, Blane Ohara, MD [address] [place],  [place] [address] PCP: Tommy Settler, DO   Assessment & Plan: Visit Diagnoses:  1. Closed Bennett's fracture of right thumb, initial encounter     Plan: Impression is minimally displaced subacute right Bennett's fracture.  I reviewed the x-rays from a few days ago which already shows callus formation.  At this point we will continue with nonsurgical treatment with a hand-based thumb spica brace.  He can start to wean this brace after couple weeks as his symptoms allow.  I do not feel that he needs hand therapy.  I would like to recheck him in 6 weeks with three-view x-rays of the right hand.  Follow-Up Instructions: Return in about 6 weeks (around 06/28/2020).   Orders:  No orders of the defined types were placed in this encounter.  No orders of the defined types were placed in this encounter.     Procedures: No procedures performed   Clinical Data: No additional findings.   Subjective: Chief Complaint  Patient presents with  . Right Hand - Injury    Tommy Steele is a 39 year old gentleman comes in for evaluation of a subacute right thumb Bennett's fracture.  He injured his thumb about a month ago when he was working in his yard and a concrete pad fell on top of of the base of his thumb.  Initially was swollen painful which has improved.  He still has some stiffness with thumb opposition.  Denies any tingling but he does have a little bit of numbness to the thumb.  No burning pain.  He is currently wearing a hand-based thumb spica brace.   Review of Systems  Constitutional: Negative.   All other systems reviewed and are negative.    Objective: Vital Signs: Ht 5\' 7"  (1.702 m)   Wt 205 lb (93 kg)   BMI 32.11 kg/m   Physical Exam Vitals and nursing  note reviewed.  Constitutional:      Appearance: He is well-developed.  HENT:     Head: Normocephalic and atraumatic.  Eyes:     Pupils: Pupils are equal, round, and reactive to light.  Pulmonary:     Effort: Pulmonary effort is normal.  Abdominal:     Palpations: Abdomen is soft.  Musculoskeletal:        General: Normal range of motion.     Cervical back: Neck supple.  Skin:    General: Skin is warm.  Neurological:     Mental Status: He is alert and oriented to person, place, and time.  Psychiatric:        Behavior: Behavior normal.        Thought Content: Thought content normal.        Judgment: Judgment normal.     Ortho Exam Right hand shows minimal swelling.  Some decreased sensation to the superficial radial nerve distribution of the thumb.  Good IP joint movement.  He has some slight decreased opposition due to stiffness and discomfort.  No neurovascular compromise.  Slight tenderness to the ulnar base of the thumb metacarpal. Specialty Comments:  No specialty comments available.  Imaging: No results found.   PMFS History: Patient Active  Problem List   Diagnosis Date Noted  . Thumb pain, right 05/09/2020  . Bloody stool 01/05/2019  . Establishing care with new doctor, encounter for 09/05/2018  . Lipoma 09/05/2018  . Abdominal pain 09/05/2018   History reviewed. No pertinent past medical history.  Family History  Problem Relation Age of Onset  . Liver cancer Maternal Grandmother   . Cancer Paternal Grandmother   . Hypertension Mother   . Stomach cancer Neg Hx   . Colon cancer Neg Hx     History reviewed. No pertinent surgical history. Social History   Occupational History  . Not on file  Tobacco Use  . Smoking status: Never Smoker  . Smokeless tobacco: Never Used  Vaping Use  . Vaping Use: Never used  Substance and Sexual Activity  . Alcohol use: Never  . Drug use: Never  . Sexual activity: Yes    Partners: Female    Birth control/protection:  Condom
